# Patient Record
Sex: Male | Born: 1940 | Race: White | Hispanic: No | Smoking: Never smoker
Health system: Southern US, Community
[De-identification: ages and names within clinical notes are randomized; demographics above are authoritative.]

## PROBLEM LIST (undated history)

## (undated) DIAGNOSIS — I499 Cardiac arrhythmia, unspecified: Secondary | ICD-10-CM

## (undated) DIAGNOSIS — C61 Malignant neoplasm of prostate: Secondary | ICD-10-CM

## (undated) DIAGNOSIS — G44009 Cluster headache syndrome, unspecified, not intractable: Secondary | ICD-10-CM

## (undated) DIAGNOSIS — I4891 Unspecified atrial fibrillation: Secondary | ICD-10-CM

## (undated) DIAGNOSIS — Z7982 Long term (current) use of aspirin: Secondary | ICD-10-CM

## (undated) DIAGNOSIS — I10 Essential (primary) hypertension: Secondary | ICD-10-CM

## (undated) HISTORY — PX: INGUINAL HERNIA REPAIR: SUR1180

## (undated) HISTORY — DX: Essential (primary) hypertension: I10

## (undated) HISTORY — DX: Malignant neoplasm of prostate: C61

## (undated) HISTORY — DX: Long term (current) use of aspirin: Z79.82

## (undated) HISTORY — PX: CHOLECYSTECTOMY: SHX55

## (undated) HISTORY — PX: PROSTATECTOMY: SHX69

## (undated) HISTORY — DX: Cardiac arrhythmia, unspecified: I49.9

## (undated) HISTORY — DX: Unspecified atrial fibrillation: I48.91

## (undated) HISTORY — DX: Cluster headache syndrome, unspecified, not intractable: G44.009

---

## 2004-03-07 HISTORY — PX: TOTAL KNEE ARTHROPLASTY: SHX125

## 2007-03-08 DIAGNOSIS — C61 Malignant neoplasm of prostate: Secondary | ICD-10-CM

## 2007-03-08 HISTORY — DX: Malignant neoplasm of prostate: C61

## 2007-08-30 ENCOUNTER — Ambulatory Visit (HOSPITAL_COMMUNITY): Admission: RE | Admit: 2007-08-30 | Discharge: 2007-08-30 | Payer: Self-pay | Admitting: Urology

## 2007-10-30 ENCOUNTER — Ambulatory Visit: Admission: RE | Admit: 2007-10-30 | Discharge: 2007-11-20 | Payer: Self-pay | Admitting: Radiation Oncology

## 2007-11-15 ENCOUNTER — Inpatient Hospital Stay (HOSPITAL_COMMUNITY): Admission: RE | Admit: 2007-11-15 | Discharge: 2007-11-16 | Payer: Self-pay | Admitting: Urology

## 2007-11-15 ENCOUNTER — Encounter (INDEPENDENT_AMBULATORY_CARE_PROVIDER_SITE_OTHER): Payer: Self-pay | Admitting: Urology

## 2008-01-02 ENCOUNTER — Ambulatory Visit: Admission: RE | Admit: 2008-01-02 | Discharge: 2008-03-13 | Payer: Self-pay | Admitting: Radiation Oncology

## 2008-03-13 ENCOUNTER — Ambulatory Visit: Admission: RE | Admit: 2008-03-13 | Discharge: 2008-04-13 | Payer: Self-pay | Admitting: Radiation Oncology

## 2009-09-29 ENCOUNTER — Encounter: Payer: Self-pay | Admitting: Cardiology

## 2009-09-29 ENCOUNTER — Ambulatory Visit: Payer: Self-pay | Admitting: Cardiology

## 2009-10-27 ENCOUNTER — Encounter: Payer: Self-pay | Admitting: Cardiology

## 2009-10-27 ENCOUNTER — Ambulatory Visit: Payer: Self-pay | Admitting: Cardiology

## 2009-10-28 ENCOUNTER — Encounter: Payer: Self-pay | Admitting: Cardiology

## 2009-10-31 ENCOUNTER — Encounter: Payer: Self-pay | Admitting: Cardiology

## 2009-11-02 ENCOUNTER — Telehealth (INDEPENDENT_AMBULATORY_CARE_PROVIDER_SITE_OTHER): Payer: Self-pay | Admitting: *Deleted

## 2009-12-22 ENCOUNTER — Ambulatory Visit: Payer: Self-pay | Admitting: Cardiology

## 2009-12-22 DIAGNOSIS — Z8546 Personal history of malignant neoplasm of prostate: Secondary | ICD-10-CM

## 2009-12-22 DIAGNOSIS — I480 Paroxysmal atrial fibrillation: Secondary | ICD-10-CM

## 2009-12-22 DIAGNOSIS — R51 Headache: Secondary | ICD-10-CM

## 2009-12-22 DIAGNOSIS — J3489 Other specified disorders of nose and nasal sinuses: Secondary | ICD-10-CM

## 2009-12-22 DIAGNOSIS — R519 Headache, unspecified: Secondary | ICD-10-CM | POA: Insufficient documentation

## 2009-12-22 DIAGNOSIS — I2510 Atherosclerotic heart disease of native coronary artery without angina pectoris: Secondary | ICD-10-CM

## 2009-12-22 DIAGNOSIS — I10 Essential (primary) hypertension: Secondary | ICD-10-CM | POA: Insufficient documentation

## 2010-01-04 ENCOUNTER — Ambulatory Visit (HOSPITAL_COMMUNITY): Admission: RE | Admit: 2010-01-04 | Discharge: 2010-01-04 | Payer: Self-pay | Admitting: Urology

## 2010-01-09 IMAGING — CR DG CHEST 2V
1 series · 1 of 1 positions shown · non-contrast
Comparison: None

CLINICAL DATA: Preoperative radiograph

CHEST - 2 VIEW

[w chest pa *]
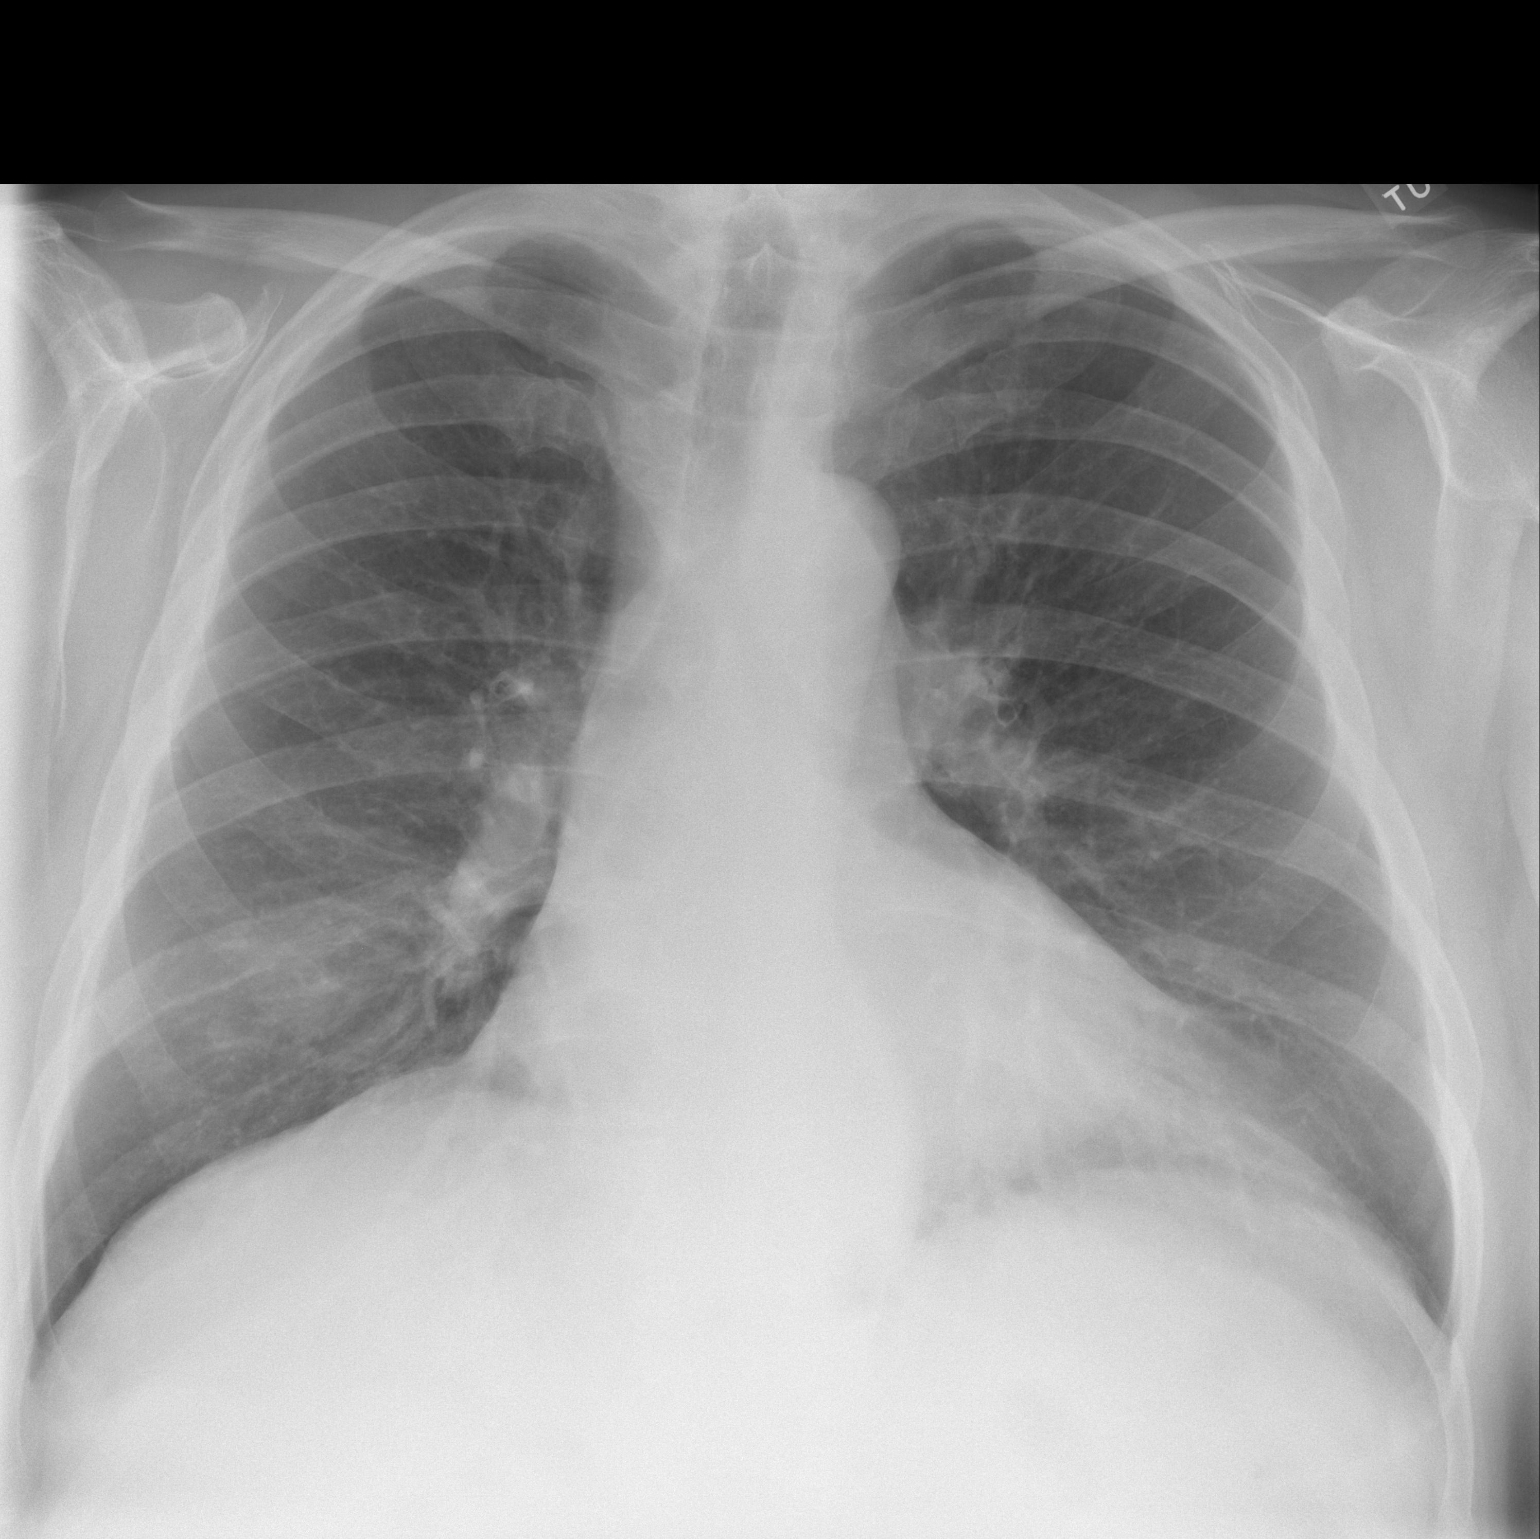

[1 of 1 positions shown; findings below may reference images not displayed]

FINDINGS: There is mild cardiac enlargement.

No pleural effusion or pulmonary interstitial edema

No airspace densities identified.
IMPRESSION: 1.  No active cardiopulmonary abnormalities.
2.  Mild cardiac enlargement.

## 2010-04-06 NOTE — Progress Notes (Signed)
Summary: WANTING TO SPEAK WITH NURSE RE: AFIB  Phone Note Call from Patient Call back at Home Phone [phone]   Caller: patient walk in Reason for Call: Talk to Nurse Details for Reason: afib Summary of Call: PATIENT CAME IN WANTING  TO SPEAK WITH SOMEONE IN REFERENCE TO HIM HAVING ANOTHER EPOSIDE WITH AFIB ON SATURDAY 8/27. STATED HE WENT [facility].  THEY GAVE HIM FLECINAIDE AND HE SAID THAT IT CORRECTED. HE HAS NOT HAD ANY MORE EPOSIDES SINCE SAT.   SHAH PUT HM ON DILTIAZEM & LISINOPRIL AND IS HAVING RINGING IN HIS EYES WITH HEADACHES IN BACK PART OF HIS HEAD & NECK. Initial call taken by: Claudette Laws,  November 02, 2009 8:43 AM  Follow-up for Phone Call        nurse called patient and he wanted to know if these medications would cause him to have ringing in the ears,head and neck pain. Patient said symptoms started within a week of starting flecainide. Patient said the back of head, and neck muscles behind ears hurt that lasted for 3 days which stopped now. Patient didn't take anything for the dull pain and said he's not sure if its from sinus or meds and had CT of sinus last week which was normal. Nurse informed patient that headache was a side effect of flecainide and MD would be informed for any recommendations. Patient has f/u with Sherryll Burger on next week and [facility] in October. Follow-up by: Carlye Grippe,  November 03, 2009 10:27 AM  Additional Follow-up for Phone Call Additional follow up Details #1::        I am not sure he is still on flecainide. Let me know, I don't think is necessarily related to medications.   Yes,patient is still taking flecainide. Please advise.Carlye Grippe  November 04, 2009 2:55 PM  Additional Follow-up by: Lewayne Bunting, MD, [facility],  November 04, 2009 12:57 PM    Additional Follow-up for Phone Call Additional follow up Details #2::    Will need to see in office.  Follow-up by: Lewayne Bunting, MD, [facility],  November 05, 2009 1:49 PM  Additional Follow-up for Phone  Call Additional follow up Details #3:: Details for Additional Follow-up Action Taken: left message on machine to call office.Carlye Grippe  November 05, 2009 3:37 PM Patient informed of the above. Patient informed nurse that his headache had eased up now and he hasn't had anymore problems.    Additional Follow-up by: Carlye Grippe,  November 05, 2009 4:18 PM

## 2010-04-06 NOTE — Consult Note (Signed)
Summary: CARDIOLOGY CONSULT/ [facility]  CARDIOLOGY CONSULT/ [facility]   Imported By: Zachary George 12/22/2009 09:10:26  _____________________________________________________________________  External Attachment:    Type:   Image     Comment:   External Document

## 2010-04-06 NOTE — Consult Note (Signed)
Summary: DR.EWAIN Elwanda [place] [facility]  DR[facility]EWAIN WILSON CONSULT [facility]   Imported By: Zachary George 12/22/2009 10:45:52  _____________________________________________________________________  External Attachment:    Type:   Image     Comment:   External Document

## 2010-04-06 NOTE — Assessment & Plan Note (Signed)
Summary: eph-post [facility] 8/24   Visit Type:  Follow-up Primary Provider:  Beatrix Fetters Shah,MD   History of Present Illness: the patient is a 70 year old male with a history of paroxysmal atrial fibrillation. The patient was hospitalized in August for atrial fibrillation and converted to normal sinus rhythm with 300 mg of p.o. flecainide. An echocardiogram showed normal LV function and a normal Cardiolite. His BNP level was near  normal at 143. His CHADS2 score was only one and the patient only required aspirin. He was discharged with pill-in-the- pocket approach for future management of atrial fibrillation. He presented back to the emergency room a week later and required 300 mg p.o. flecainide with restoration of normal sinus rhythm. He has done well since that time with no recurrent palpitations. He currently remains in normal sinus rhythm. He does have difficulty controlling his blood pressure and recently some medication changes were made. He notices that his blood pressures highest in the morning he was given hydrochlorothiazide but cannot take this every day due to significant diuresis. The patient also has not had a workup for sleep apnea.  Preventive Screening-Counseling & Management  Alcohol-Tobacco     Smoking Status: never  Current Medications (verified): 1)  Cardizem Cd 240 Mg Xr24h-Cap (Diltiazem Hcl Coated Beads) .... Take 1 Tablet By Mouth Once A Day 2)  Benicar 20 Mg Tabs (Olmesartan Medoxomil) .... Take 1 Tablet By Mouth Two Times A Day 3)  Chlorthalidone 25 Mg Tabs (Chlorthalidone) .... Take 1/2 Tab (12.5mg ) Daily 4)  Flecainide Acetate 150 Mg Tabs (Flecainide Acetate) .... Take 2 Tablet By Mouth As Needed A-Fib 5)  Aspirin 325 Mg Tabs (Aspirin) .... Take 1 Tablet By Mouth Once A Day As Needed 6)  Flonase 50 Mcg/act Susp (Fluticasone Propionate) .... One - Two Puffs in Each Nostril Two Times A Day As Needed  Allergies: 1)  ! Demerol  Comments:  Nurse/Medical Assistant: The  patient's medication bottles and allergies were reviewed with the patient and were updated in the Medication and Allergy Lists.  Past History:  Past Surgical History: Last updated: 12/22/2009 Left total knee replacement in [address] Cholecystectomy Rt. Inguinal hernia repair  Social History: Last updated: 12/22/2009 Widowed  Tobacco Use - No.  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  The patient denies fatigue, malaise, fever, weight gain/loss, vision loss, decreased hearing, hoarseness, chest pain, palpitations, shortness of breath, prolonged cough, wheezing, sleep apnea, coughing up blood, abdominal pain, blood in stool, nausea, vomiting, diarrhea, heartburn, incontinence, blood in urine, muscle weakness, joint pain, leg swelling, rash, skin lesions, fainting, dizziness, depression, anxiety, enlarged lymph nodes, easy bruising or bleeding, and environmental allergies.         congestion  Vital Signs:  Patient profile:   70 year old male Height:      73 inches Weight:      253 pounds BMI:     33.50 Pulse rate:   64 / minute BP sitting:   148 / 89  (left arm) Cuff size:   large  Vitals Entered By: Carlye Grippe (December 22, 2009 10:16 AM)  Nutrition Counseling: Patient's BMI is greater than 25 and therefore counseled on  weight management options.  Physical Exam  Additional Exam:  General: Well-developed, well-nourished in no distress head: Normocephalic and atraumatic eyes PERRLA/EOMI intact, conjunctiva and lids normal nose: No deformity or lesions mouth normal dentition, normal posterior  pharynx neck: Supple, no JVD.  No masses, thyromegaly or abnormal cervical nodes lungs: Normal breath sounds bilaterally without wheezing.  Normal percussion heart: regular rate and rhythm with normal S1 and S2, no S3 or S4.  PMI is normal.  No pathological murmurs abdomen: Normal bowel sounds, abdomen is soft and nontender without masses, organomegaly or hernias noted.  No hepatosplenomegaly musculoskeletal: Back normal, normal gait muscle strength and tone normal pulsus: Pulse is normal in all 4 extremities Extremities: No peripheral pitting edema neurologic: Alert and oriented x 3 skin: Intact without lesions or rashes cervical nodes: No significant adenopathy psychologic: Normal affect    EKG  Procedure date:  12/22/2009  Findings:      normal sinus rhythm. Left axis deviation nonspecific ST-T wave changes.  Impression & Recommendations:  Problem # 1:  ATRIAL FIBRILLATION (ICD-427.31)  Patient remains in normal sinus rhythm. We will continue the management of his atrial fibrillation with a pill in the pocket approach with 300 mg of flecainide. He is at low risk for thromboembolic disease and will remain on aspirin His updated medication list for this problem includes:    Flecainide Acetate 150 Mg Tabs (Flecainide acetate) .Marland Kitchen... Take 2 tablet by mouth as needed a-fib    Aspirin 325 Mg Tabs (Aspirin) .Marland Kitchen... Take 1 tablet by mouth once a day as needed  Orders: EKG w/ Interpretation (93000)  His updated medication list for this problem includes:    Flecainide Acetate 150 Mg Tabs (Flecainide acetate) .Marland Kitchen... Take 2 tablet by mouth as needed a-fib    Aspirin 325 Mg Tabs (Aspirin) .Marland Kitchen... Take 1 tablet by mouth once a day as needed  Problem # 2:  HEADACHE (ICD-784.0) Gven his difficult to control blood pressure and recurrent headaches the patient should be screened for obstructive sleep apnea. This can be performed by his primary care physician. His updated medication list for this  problem includes:    Aspirin 325 Mg Tabs (Aspirin) .Marland Kitchen... Take 1 tablet by mouth once a day as needed  Problem # 3:  HYPERTENSION (ICD-401.9)  blood pressure appears to be difficult to control. He does not take hydrochlorothiazide every day and I suggested to the patient that we would switch him to chlorthalidone 12.5 mg p.o. q. daily His updated medication list for this problem includes:    Cardizem Cd 240 Mg Xr24h-cap (Diltiazem hcl coated beads) .Marland Kitchen... Take 1 tablet by mouth once a day    Benicar 20 Mg Tabs (Olmesartan medoxomil) .Marland Kitchen... Take 1 tablet by mouth two times a day    Chlorthalidone 25 Mg Tabs (Chlorthalidone) .Marland Kitchen... Take 1/2 tab (12.5mg ) daily    Aspirin 325 Mg Tabs (Aspirin) .Marland Kitchen... Take 1 tablet by mouth once a day as needed  His updated medication list for this problem includes:    Cardizem Cd 240 Mg Xr24h-cap (Diltiazem hcl coated beads) .Marland Kitchen... Take 1 tablet by mouth once a day    Benicar 20 Mg Tabs (Olmesartan medoxomil) .Marland Kitchen... Take 1 tablet by mouth two times a day    Chlorthalidone 25 Mg Tabs (Chlorthalidone) .Marland Kitchen... Take 1/2 tab (12.5mg ) daily    Aspirin 325 Mg Tabs (Aspirin) .Marland Kitchen... Take 1 tablet by mouth once a day as needed  Problem # 4:  OTHER DISEASES OF NASAL CAVITY AND  SINUSES (ICD-478.19)  Ihave given the patient a prescription of Flonase nasal spray but he will have a followup with ENT to further elucidate the problem is chronic sinus drainage and/or sinusitis  Patient Instructions: 1)  Flonase Nasal Spray 2)  Stop HCTZ 3)  Begin Chlorthalidone 12.5mg  daily 4)  Follow up in  6 months Prescriptions: FLONASE 50 MCG/ACT SUSP (FLUTICASONE PROPIONATE) one - two puffs in each nostril two times a day as needed  #1 x 1   Entered by:   Hoover Brunette, LPN   Authorized by:   Lewayne Bunting, MD, [facility]   Signed by:   Hoover Brunette, LPN on 16/12/9602   Method used:   Electronically to        [facility]* (retail)       [address]       [place], [place]  [address]        Ph: [phone]       Fax: [phone]   RxID:   [id number] CHLORTHALIDONE 25 MG TABS (CHLORTHALIDONE) take 1/2 tab (12.5mg ) daily  #15 x 6   Entered by:   Hoover Brunette, LPN   Authorized by:   Lewayne Bunting, MD, [facility]   Signed by:   Hoover Brunette, LPN on 01/01/2535   Method used:   Electronically to        [facility]* (retail)       [address]       [place], [place]  [address]       Ph: [phone]       Fax: [phone]   RxID:   [id number]   Handout requested.

## 2010-04-06 NOTE — Letter (Signed)
Summary: [facility] D[facility]C DR. Beatrix Fetters [facility]  [facility] D[facility]C DR. Kirstie Peri   Imported By: Zachary George 12/22/2009 09:25:06  _____________________________________________________________________  External Attachment:    Type:   Image     Comment:   External Document

## 2010-05-19 LAB — BASIC METABOLIC PANEL
BUN: 15 mg/dL (ref 6–23)
Creatinine, Ser: 1.13 mg/dL (ref 0.4–1.5)
GFR calc non Af Amer: >60 mL/min (ref >60)

## 2010-05-19 LAB — CBC
Platelets: 205 10*3/uL (ref 150–400)
RDW: 17 % — ABNORMAL HIGH (ref 11.5–15.5)
WBC: 7.2 10*3/uL (ref 4.0–10.5)

## 2010-05-19 LAB — SURGICAL PCR SCREEN: MRSA, PCR: NEGATIVE

## 2010-07-20 NOTE — Op Note (Signed)
NAME:  BRALEN, WILTGEN NO.:  [id number]   MEDICAL RECORD NO.:  [id number]          PATIENT TYPE:  INP   LOCATION:  1432                         FACILITY:  [facility]   PHYSICIAN:  Heloise Purpura, MD      DATE OF BIRTH:  [birth year]   DATE OF PROCEDURE:  11/15/2007  DATE OF DISCHARGE:                               OPERATIVE REPORT   PREOPERATIVE DIAGNOSIS:  Clinically localized adenocarcinoma of the  prostate (clinical stage T1c Nx Mx).   POSTOPERATIVE DIAGNOSIS:  Clinically localized adenocarcinoma of the  prostate (clinical stage T1c Nx Mx).   PROCEDURES:  1. Robotic-assisted laparoscopic radical prostatectomy (partial      bilateral nerve-sparing).  2. Bilateral laparoscopic pelvic lymphadenectomy.   SURGEON:  Heloise Purpura, MD   ASSISTANT:  Delia Chimes, RN, and Delman Kitten, MD.   ANESTHESIA:  General.   COMPLICATIONS:  None.   ESTIMATED BLOOD LOSS:  250 mL.   INTRAVENOUS FLUIDS:  2 liters of lactated Ringer's.   SPECIMENS:  1. Prostate and seminal vesicles.  2. Right pelvic lymph nodes.  3. Left pelvic lymph nodes.   DISPOSITION OF SPECIMENS:  To pathology.   DRAINS:  1. A 20-French coude catheter.  2. A #19 Blake pelvic drain.   INDICATION:  Mr. Mccaughey is a 70 year old gentleman with clinically  localized adenocarcinoma of the prostate.  After discussion regarding  management options for treatment, he elected to proceed with surgical  therapy and the above procedures.  Potential risks, complications, and  alternative treatment options were discussed in detail with the patient  and informed consent was obtained.   DESCRIPTION OF PROCEDURE:  The patient was taken to the operating room  and a general anesthetic was administered.  He was given preoperative  antibiotics, placed in the dorsal lithotomy position, and prepped and  draped in the usual sterile fashion.  Next a preoperative time-out was  performed.  A site was selected just to the left  of the umbilicus for  placement of the camera port and a 12-mm port was then placed using an  open Hasson approach.  This allowed entry in the peritoneal cavity under  direct vision and without difficulty.  The 12-mm port was then placed  and a pneumoperitoneum established.  The 0-degree lens was then used to  inspect the abdomen and there was no evidence for any intra-abdominal  injuries.  The patient was noted to have a large right direct hernia  defect in the inguinal region.  He was noted to have a large right  inguinal hernia preoperatively and had been evaluated by Dr. Corliss Skains and  it was felt that this would best be surgically treated at a separate  setting.  The patient was not noted to have any bowel or other intra-  abdominal contents within the hernia defect.  The remaining ports were  then placed with bilateral 8-mm robotic ports placed 10 cm lateral to  and just inferior to the camera port site.  An additional port was  placed in the far left lateral abdominal wall.  A 5-mm port was placed  between the camera port and the right robotic port.  An additional 12-mm  port was placed in the far right lateral abdominal wall for laparoscopic  assistance.  All ports were placed under direct vision and without  difficulty.  The surgical cart was then docked.  With the aid of the  cautery scissors, the bladder was reflected posteriorly allowing entry  into space of Retzius and identification of the endopelvic fascia and  prostate.  The endopelvic fascia was then incised from the apex back to  the base of prostate bilaterally and the underlying levator muscle  fibers were swept laterally off the prostate, thereby isolating the  dorsal venous complex.  The dorsal venous complex was then stapled and  divided with a 45-mm flex ETS stapler.  The bladder neck was identified  with the aid of Foley catheter manipulation and was divided anteriorly,  thereby exposing the Foley catheter.  The  catheter balloon was deflated  and the catheter was brought into the operative field and used to  retract the prostate anteriorly.  This exposed the posterior bladder  neck, which was then divided and dissection proceeded posteriorly  between the bladder and prostate until the vasa deferentia and seminal  vesicles were identified.  The vasa deferentia were isolated, divided  and lifted anteriorly.  The seminal vesicles were then dissected down to  their tips with care to control the seminal vesicle arterial blood  supply.  The seminal vesicles were then lifted anteriorly and the space  between Denonvilliers fascia and the anterior rectum was bluntly  developed, thereby isolating the vascular pedicles of the prostate.  The  lateral prostatic fascia was incised bilaterally allowing the  neurovascular bundles to be released.  Hem-o-lok clips were then used to  ligate the vascular pedicles of the prostate.  Due to the patient's  preoperative sexual inactivity and the large-volume, although  nonpalpable, cancer on his biopsy, a partial nerve-sparing procedure was  performed leaving some periprostatic tissue on the prostate but sparing  the neurovascular bundles.  The neurovascular bundles were swept off the  apex of the prostate and the urethra was divided allowing the specimen  to be disarticulated.  The pelvis was copiously irrigated and hemostasis  was ensured.  There was no evidence of a rectal injury.  Attention then  turned the right pelvic sidewall.  The fibrofatty tissue between the  external iliac vein, confluence of the iliac vessels, the internal iliac  artery and Cooper's ligament was dissected free from the pelvic sidewall  with care to preserve the obturator nerve.  Hem-o-lok were used for  lymphostasis and hemostasis.  An identical procedure was then performed  on the contralateral side.  Attention then turned to the urethral  anastomosis.  A 2-0 Vicryl slip-knot was placed  between Denonvilliers  fascia, the posterior bladder neck and the posterior urethra to  reapproximate these structures.  A double-armed 3-0 Monocryl suture was  then used to perform a 360-degree running tension-free anastomosis  between the bladder neck and urethra.  A new 20-French coude catheter  was inserted into the bladder and irrigated.  There were no blood clots  within the bladder and the anastomosis appeared to be watertight.  A #19  Blake drain was then brought through the left robotic port and  appropriately positioned in the pelvis.  It was secured to skin with a  nylon suture.  The surgical cart was then undocked.  The right lateral  12-mm port site was closed  with a 0 Vicryl suture.  This was placed with  the aid of the suture passer device.  The prostate specimen and each  lymphatic packet, which had been tagged for identification, were then  removed within the Endopouch retrieval bag via the periumbilical port  site.  All remaining ports were removed under direct vision and the  periumbilical fascial opening was closed with a running 0 Vicryl suture.  All port sites were injected with 0.25% Marcaine and  reapproximated at the skin level with staples.  Sterile dressings were  applied.  The patient appeared to tolerate the procedure well and  without complications.  He was able to be extubated and transferred to  the recovery unit in satisfactory condition.      Heloise Purpura, MD  Electronically Signed     LB/MEDQ  D:  11/15/2007  T:  11/16/2007  Job:  [id number]

## 2010-07-20 NOTE — Discharge Summary (Signed)
NAME:  Vincent Moran, Vincent Moran NO.:  [id number]   MEDICAL RECORD NO.:  [id number]          PATIENT TYPE:  INP   LOCATION:  1432                         [facility]AC[facility]   PHYSICIAN:  Heloise Purpura, MD      DATE OF BIRTH:  June 15, 1940   DATE OF ADMISSION:  11/15/2007  DATE OF DISCHARGE:  11/16/2007                               DISCHARGE SUMMARY   ADMISSION DIAGNOSIS:  Clinically localized adenocarcinoma of the  prostate, clinical stage T1C, NX, MX.   DISCHARGE DIAGNOSES:  Clinically localized adenocarcinoma of the  prostate, clinical stage T1C, NX, MX.   PROCEDURES:  1. Robotic-assisted laparoscopic radical prostatectomy.  2. Bilateral laparoscopic pelvic lymphadenectomy.   HISTORY AND PHYSICAL:  For full details, please see admission history  and physical.  Briefly, Mr. Vincent Moran is a 70 year old gentleman, who was  found to have clinically localized adenocarcinoma of the prostate.  After careful consideration regarding management options for treatment,  he elected to proceed with surgical therapy with a robotic-assisted  laparoscopic radical prostatectomy with partial bilateral nerve spare  and bilateral laparoscopic pelvic lymphadenectomy.   HOSPITAL COURSE:  On November 15, 2007, Mr. Vincent Moran was taken to the  operating room and underwent a robotic-assisted laparoscopic radical  prostatectomy and bilateral laparoscopic pelvic lymphadenectomy, which  he tolerated well and without complications.  Postoperatively, he was  able to be transferred to a regular hospital room following recovery  from anesthesia.  He was able to begin ambulating the night of surgery,  which he did without difficulty.  He was monitored that evening and  remained hemodynamically stable.  His hematocrit was checked  postoperatively and found to be stable at 43.8.  On the morning of  postoperative day #1, his hematocrit was rechecked and found to be 41.6.  He did have excellent urine output with  expected minimal output from his  pelvic drain.  Therefore, his pelvic drain was removed.  He did complain  of some feelings of being swimmy-headed, but urine output, blood  pressure and pulse all remained stable.  He was placed on a clear liquid  diet and continued to ambulate.  He was re-evaluated on the afternoon of  postoperative day #1 and had reported passing flatus.  He continued to  have some feelings of being swimmy-headed, but to a lesser degree.  His urine output, blood pressure and pulse all continued to be stable.  It was felt that these feelings were likely due from the anesthetics  used during surgery, as he stated receiving same type of effects after  other surgical procedures.  He was, therefore, felt to be stable for  discharge and had met all discharge criteria.   DISPOSITION:  Home.   DISCHARGE MEDICATIONS:  He was instructed to resume his regular home  medications, including Norvasc and Vasotec.  In addition, he was  provided a prescription for Vicodin to take as needed for pain and told  to use Colace as a stool softener.  He was given a prescription to begin  Cipro one day prior to his return visit for Foley catheter removal.  DISCHARGE INSTRUCTIONS:  He was instructed to be ambulatory, but  specifically told to refrain from heavy lifting, strenuous activity or  driving.  He was instructed on routine Foley catheter care and told to  gradually advance his diet over the course of the next few days.   FOLLOWUP:  He will follow up in one week for removal of Foley catheter  and skin staples.      Delia Chimes, RN      Heloise Purpura, MD  Electronically Signed    MA/MEDQ  D:  11/17/2007  T:  11/17/2007  Job:  [id number]

## 2010-07-29 DIAGNOSIS — I4891 Unspecified atrial fibrillation: Secondary | ICD-10-CM

## 2010-07-30 ENCOUNTER — Encounter: Payer: Self-pay | Admitting: *Deleted

## 2010-08-03 ENCOUNTER — Ambulatory Visit (INDEPENDENT_AMBULATORY_CARE_PROVIDER_SITE_OTHER): Payer: Medicare Other | Admitting: Cardiology

## 2010-08-03 ENCOUNTER — Encounter: Payer: Self-pay | Admitting: *Deleted

## 2010-08-03 ENCOUNTER — Encounter: Payer: Self-pay | Admitting: Cardiology

## 2010-08-03 VITALS — BP 147/98 | HR 67

## 2010-08-03 DIAGNOSIS — R51 Headache: Secondary | ICD-10-CM

## 2010-08-03 DIAGNOSIS — I10 Essential (primary) hypertension: Secondary | ICD-10-CM

## 2010-08-03 DIAGNOSIS — I4891 Unspecified atrial fibrillation: Secondary | ICD-10-CM

## 2010-08-03 MED ORDER — ENALAPRIL MALEATE 5 MG PO TABS: 5.0000 mg | ORAL_TABLET | Freq: Two times a day (BID) | ORAL | Status: DC

## 2010-08-03 NOTE — Assessment & Plan Note (Signed)
Hypertension: Blood pressure poorly controlled and will increase enalapril to 5 mg twice a day.

## 2010-08-03 NOTE — Patient Instructions (Signed)
Your physician wants you to follow-up in: 6 months. You will receive a reminder letter in the mail one-two months in advance. If you don't receive a letter, please call our office to schedule the follow-up appointment.  Increase Enalapril to 5mg  two times a day.

## 2010-08-03 NOTE — Progress Notes (Signed)
HPI the patient is a 70 year old male with a history of paroxysmal atrial fibrillation. The patient was hospitalized in August for atrial fibrillation and converted to normal sinus rhythm with 300 mg of p.o. flecainide. An echocardiogram showed normal LV function and a normal Cardiolite. His BNP level was near  normal at 143. His CHADS2 score was only one and the patient only required aspirin. He was discharged with pill-in-the- pocket approach for future management of atrial fibrillation. He presented back to the emergency room a week later and required 300 mg p.o. flecainide with restoration of normal sinus rhythm. He has done well since that time with no recurrent palpitations.  However just last week the patient to come back in the hospital because of palpitations and recurrence of atrial fibrillation. This occurred in the setting of sinusitis and hypertension. He was given Cardizem in the emergency room and apparently when back in normal sinus rhythm. He did not have diffuse flecainide. Today he is in normal sinus rhythm. He states that his been doing well. He had no recurrent palpitations his blood pressure however is poorly controlled. He has had persistent headache since hospitalization. However his primary care physician did a CT scan and MRI which were within normal limits. From a cardiovascular standpoint the patient is doing well. EKG normal sinus rhythm heart rate 59 beats per minute. No other significant abnormalities.  Allergies  Allergen Reactions  . Meperidine Hcl     REACTION: hyper/hallucinate    Current Outpatient Prescriptions on File Prior to Visit  Medication Sig Dispense Refill  . aspirin 325 MG tablet Take 325 mg by mouth daily as needed.        . chlorthalidone (HYGROTON) 25 MG tablet Take 12.5 mg by mouth daily.        . flecainide (TAMBOCOR) 150 MG tablet Take 300 mg by mouth as needed. As needed       . fluticasone (FLONASE) 50 MCG/ACT nasal spray 2 sprays by Nasal  route daily.        Marland Kitchen DISCONTD: diltiazem (CARDIZEM CD) 240 MG 24 hr capsule Take 240 mg by mouth daily.        Marland Kitchen DISCONTD: olmesartan (BENICAR) 20 MG tablet Take 20 mg by mouth 2 (two) times daily.          Past Medical History  Diagnosis Date  . Hypertension   . Prostate cancer 2009    Status post robotic radical prostatectomy in 2009  . Atrial fibrillation   . Headaches, cluster   . Cardiac dysrhythmia, unspecified   . Encounter for long-term (current) use of aspirin     Past Surgical History  Procedure Date  . Total knee arthroplasty 2006  . Cholecystectomy   . Inguinal hernia repair     RIGHT    No family history on file.  History   Social History  . Marital Status: Widowed    Spouse Name: N/A    Number of Children: N/A  . Years of Education: N/A   Occupational History  . RETIRED    Social History Main Topics  . Smoking status: Never Smoker   . Smokeless tobacco: Never Used  . Alcohol Use: No  . Drug Use: No  . Sexually Active: Not on file   Other Topics Concern  . Not on file   Social History Narrative   Last updated: 10/18/2011Widowed Tobacco Use - No. Alcohol Use - yes on occasionRetired from [employer]   OZD:[id number] positives as outlined above. The  remainder of the 18  point review of systems is negative  PHYSICAL EXAM BP 147/98  Pulse 67  General: Well-developed, well-nourished in no distress Head: Normocephalic and atraumatic Eyes:PERRLA/EOMI intact, conjunctiva and lids normal Ears: No deformity or lesions Mouth:normal dentition, normal posterior pharynx Neck: Supple, no JVD.  No masses, thyromegaly or abnormal cervical nodes Lungs: Normal breath sounds bilaterally without wheezing.  Normal percussion Cardiac: regular rate and rhythm with normal S1 and S2, no S3 or S4.  PMI is normal.  No pathological murmurs Abdomen: Normal bowel sounds, abdomen is soft and nontender without masses, organomegaly or hernias noted.  No hepatosplenomegaly MSK:  Back normal, normal gait muscle strength and tone normal Vascular: Pulse is normal in all 4 extremities Extremities: No peripheral pitting edema Neurologic: Alert and oriented x 3 Skin: Intact without lesions or rashes Lymphatics: No significant adenopathy Psychologic: Normal affect  ECG:EKG normal sinus rhythm heart rate 59 beats per minute. No other significant abnormalities.  ASSESSMENT AND PLAN

## 2010-08-03 NOTE — Assessment & Plan Note (Signed)
Paroxysmal Atrial fibrillation: Patient remains now in normal sinus rhythm. He did not require flecainide during his recent admission. It appears that every time his atrial fibrillation is set off by an acute infectious process.

## 2010-08-03 NOTE — Assessment & Plan Note (Signed)
Evaluated by his primary care physician with CT and MRI

## 2010-09-29 DIAGNOSIS — I4891 Unspecified atrial fibrillation: Secondary | ICD-10-CM

## 2010-10-02 ENCOUNTER — Emergency Department (HOSPITAL_COMMUNITY)
Admission: EM | Admit: 2010-10-02 | Discharge: 2010-10-02 | Disposition: A | Discharge status: Home / Self Care | Payer: Medicare Other | Attending: Emergency Medicine | Admitting: Emergency Medicine

## 2010-10-02 ENCOUNTER — Emergency Department (HOSPITAL_COMMUNITY): Payer: Medicare Other

## 2010-10-02 DIAGNOSIS — Z79899 Other long term (current) drug therapy: Secondary | ICD-10-CM | POA: Insufficient documentation

## 2010-10-02 DIAGNOSIS — K5730 Diverticulosis of large intestine without perforation or abscess without bleeding: Secondary | ICD-10-CM | POA: Insufficient documentation

## 2010-10-02 DIAGNOSIS — K5900 Constipation, unspecified: Secondary | ICD-10-CM | POA: Insufficient documentation

## 2010-10-02 DIAGNOSIS — R51 Headache: Secondary | ICD-10-CM | POA: Insufficient documentation

## 2010-10-02 DIAGNOSIS — R110 Nausea: Secondary | ICD-10-CM | POA: Insufficient documentation

## 2010-10-02 DIAGNOSIS — R109 Unspecified abdominal pain: Secondary | ICD-10-CM | POA: Insufficient documentation

## 2010-10-02 DIAGNOSIS — I4891 Unspecified atrial fibrillation: Secondary | ICD-10-CM | POA: Insufficient documentation

## 2010-10-02 LAB — DIFFERENTIAL
Basophils Relative: 0 % (ref 0–1)
Eosinophils Absolute: 0.1 10*3/uL (ref 0.0–0.7)
Eosinophils Relative: 2 % (ref 0–5)
Monocytes Relative: 8 % (ref 3–12)
Neutrophils Relative %: 83 % — ABNORMAL HIGH (ref 43–77)

## 2010-10-02 LAB — CBC
MCH: 27.6 pg (ref 26.0–34.0)
Platelets: 225 10*3/uL (ref 150–400)
RBC: 5.21 MIL/uL (ref 4.22–5.81)
RDW: 16.5 % — ABNORMAL HIGH (ref 11.5–15.5)
WBC: 8.5 10*3/uL (ref 4.0–10.5)

## 2010-10-02 LAB — URINE MICROSCOPIC-ADD ON

## 2010-10-02 LAB — COMPREHENSIVE METABOLIC PANEL
ALT: 50 U/L (ref 0–53)
AST: 21 U/L (ref 0–37)
Albumin: 3.6 g/dL (ref 3.5–5.2)
CO2: 25 mEq/L (ref 19–32)
Calcium: 9.5 mg/dL (ref 8.4–10.5)
GFR calc non Af Amer: >60 mL/min (ref >60)
Sodium: 140 mEq/L (ref 135–145)
Total Protein: 7 g/dL (ref 6.0–8.3)

## 2010-10-02 LAB — URINALYSIS, ROUTINE W REFLEX MICROSCOPIC
Glucose, UA: NEGATIVE mg/dL
Hgb urine dipstick: NEGATIVE
Specific Gravity, Urine: 1.025 (ref 1.005–1.030)
pH: 6 (ref 5.0–8.0)

## 2010-10-04 ENCOUNTER — Emergency Department (HOSPITAL_COMMUNITY): Payer: Medicare Other

## 2010-10-04 ENCOUNTER — Emergency Department (HOSPITAL_COMMUNITY)
Admission: EM | Admit: 2010-10-04 | Discharge: 2010-10-04 | Disposition: A | Discharge status: Home / Self Care | Payer: Medicare Other | Attending: Emergency Medicine | Admitting: Emergency Medicine

## 2010-10-04 DIAGNOSIS — R112 Nausea with vomiting, unspecified: Secondary | ICD-10-CM | POA: Insufficient documentation

## 2010-10-04 DIAGNOSIS — Z79899 Other long term (current) drug therapy: Secondary | ICD-10-CM | POA: Insufficient documentation

## 2010-10-04 DIAGNOSIS — K5900 Constipation, unspecified: Secondary | ICD-10-CM | POA: Insufficient documentation

## 2010-10-04 DIAGNOSIS — R109 Unspecified abdominal pain: Secondary | ICD-10-CM | POA: Insufficient documentation

## 2010-10-04 DIAGNOSIS — I498 Other specified cardiac arrhythmias: Secondary | ICD-10-CM | POA: Insufficient documentation

## 2010-10-04 LAB — POCT I-STAT, CHEM 8
Calcium, Ion: 1.14 mmol/L (ref 1.12–1.32)
HCT: 47 % (ref 39.0–52.0)
TCO2: 23 mmol/L (ref 0–100)

## 2010-10-04 LAB — URINALYSIS, ROUTINE W REFLEX MICROSCOPIC
Ketones, ur: 40 mg/dL — AB
Leukocytes, UA: NEGATIVE
Nitrite: NEGATIVE
Protein, ur: NEGATIVE mg/dL

## 2010-10-04 LAB — CBC
Hemoglobin: 14.1 g/dL (ref 13.0–17.0)
MCH: 26.6 pg (ref 26.0–34.0)
Platelets: 232 10*3/uL (ref 150–400)
RBC: 5.31 MIL/uL (ref 4.22–5.81)

## 2010-10-04 LAB — DIFFERENTIAL
Basophils Absolute: 0 10*3/uL (ref 0.0–0.1)
Basophils Relative: 0 % (ref 0–1)
Eosinophils Absolute: 0.2 10*3/uL (ref 0.0–0.7)
Neutro Abs: 7.8 10*3/uL — ABNORMAL HIGH (ref 1.7–7.7)
Neutrophils Relative %: 80 % — ABNORMAL HIGH (ref 43–77)

## 2010-12-08 LAB — ABO/RH: ABO/RH(D): O POS

## 2010-12-08 LAB — CBC
MCV: 87.4
Platelets: 230
RDW: 15.7 — ABNORMAL HIGH
WBC: 6.9

## 2010-12-08 LAB — BASIC METABOLIC PANEL
BUN: 10
Chloride: 104
Creatinine, Ser: 0.84
GFR calc non Af Amer: >60
Glucose, Bld: 87

## 2010-12-08 LAB — HEMOGLOBIN AND HEMATOCRIT, BLOOD: HCT: 41.6

## 2010-12-08 LAB — TYPE AND SCREEN

## 2010-12-17 ENCOUNTER — Ambulatory Visit (INDEPENDENT_AMBULATORY_CARE_PROVIDER_SITE_OTHER): Payer: Medicare Other | Admitting: Cardiology

## 2010-12-17 VITALS — BP 152/105 | HR 65 | Ht 73.0 in | Wt 257.0 lb

## 2010-12-17 DIAGNOSIS — I498 Other specified cardiac arrhythmias: Secondary | ICD-10-CM

## 2010-12-17 DIAGNOSIS — I10 Essential (primary) hypertension: Secondary | ICD-10-CM

## 2010-12-17 DIAGNOSIS — Z79899 Other long term (current) drug therapy: Secondary | ICD-10-CM

## 2010-12-17 DIAGNOSIS — I480 Paroxysmal atrial fibrillation: Secondary | ICD-10-CM

## 2010-12-17 DIAGNOSIS — R51 Headache: Secondary | ICD-10-CM

## 2010-12-17 DIAGNOSIS — R001 Bradycardia, unspecified: Secondary | ICD-10-CM

## 2010-12-17 DIAGNOSIS — I2510 Atherosclerotic heart disease of native coronary artery without angina pectoris: Secondary | ICD-10-CM

## 2010-12-17 DIAGNOSIS — I4891 Unspecified atrial fibrillation: Secondary | ICD-10-CM

## 2010-12-17 MED ORDER — CARVEDILOL 6.25 MG PO TABS: 6.2500 mg | ORAL_TABLET | Freq: Two times a day (BID) | ORAL | Status: DC

## 2010-12-17 MED ORDER — CLONIDINE HCL 0.1 MG PO TABS: 0.1000 mg | ORAL_TABLET | Freq: Every day | ORAL | Status: DC

## 2010-12-17 MED ORDER — CHLORTHALIDONE 25 MG PO TABS: 25.0000 mg | ORAL_TABLET | Freq: Every day | ORAL | Status: DC

## 2010-12-17 MED ORDER — ENALAPRIL MALEATE 20 MG PO TABS: 20.0000 mg | ORAL_TABLET | Freq: Two times a day (BID) | ORAL | Status: DC

## 2010-12-17 NOTE — Assessment & Plan Note (Signed)
Medications changes as outlined above. CHADS2 score is still low at 1. Continue aspirin for now. Will need to start Coumadin when patient age 70.

## 2010-12-17 NOTE — Assessment & Plan Note (Signed)
We'll try to simplify patient's medical regimen and I want him to start again on chlorthalidone 25 mg a day [is has been associated with some cramping on his part previously], increase enalapril to 20 mg by mouth twice a day, decrease clonidine to 0.1 mg by mouth each bedtime, discontinue Cardizem, and change to Coreg 6.25 g by mouth twice a day. The patient will need next week in 5 days a BMET to check his potassium and creatinine on high-dose ACE inhibitor. He'll also need a 48 hour Holter monitor to make sure that he does not develop excessive bradycardia on Coreg.

## 2010-12-17 NOTE — Patient Instructions (Signed)
Follow up as scheduled. Your physician has recommended that you wear a holter monitor. Holter monitors are medical devices that record the heart's electrical activity. Doctors most often use these monitors to diagnose arrhythmias. Arrhythmias are problems with the speed or rhythm of the heartbeat. The monitor is a small, portable device. You can wear one while you do your normal daily activities. This is usually used to diagnose what is causing palpitations/syncope (passing out). RETURN ON Monday AS SCHEDULED TO HAVE THIS PLACED.  Chlorthalidone 25 mg daily. Enalapril 20 mg two times a day. Clonidine 0.1 mg every night. Stop Cardizem. Start Coreg (carvedilol) 6.25mg  two times a day.  Your physician recommends that you go to the [facility] for lab work: [employer]. DO IN 5 DAYS.

## 2010-12-17 NOTE — Assessment & Plan Note (Signed)
Felt to be related secondary to Cardizem.

## 2010-12-17 NOTE — Assessment & Plan Note (Signed)
Patient has no history of coronary artery disease and flecainide is to be used safely when necessary in the future

## 2010-12-17 NOTE — Progress Notes (Signed)
History of present illness:  The patient is a 70 year old male with a history of paroxysmal atrial fibrillation managing himself with a pill-in-the- pocket approach with flecainide. The patient has done well. He has had 2 brief episodes of atrial fibrillation very short lived since his last office visit here. Interestingly these episodes have always been somewhat vaguely mediated. One episode occurred when he got really hot and then went inside to drink cold soda and he developed very quickly thereafter a brief episode of atrial fibrillation. Another episode he describes when he went to the emergency room and had to drink contrast and was nauseated. At that time he also developed atrial fibrillation briefly albeit rate controlled.  The patient has had problems with his blood pressure which has been difficult to control requiring various changes in medications. Also his Cardizem eventually has been implicated as a cause for his headaches and has been consistently down titrated. He is now only taking 120 mg a day of Cardizem. Clonidine apparently makes him rather sleepy and dizzy and has also been down titrated. The patient denies any chest pain shortness of breath orthopnea PND. He does have a thyroid evaluation for chronic headaches with an MRI. His EKG today demonstrates sinus bradycardia with a heart rate of 57 beats per minute.   Allergies, family history and social history: As documented in chart and reviewed  Medications: Documented and reviewed in chart.  Past medical history: Reviewed and see problem list below   Review of systems: No nausea or vomiting. No fever or chills. No melena hematochezia. No dysuria or frequency. Occasional palpitations as outlined above. Occasional headaches. No visual changes.   Physical examination : Vital signs documented below General: Well-nourished white male in no apparent distress HEENT: EOMI, PERRLA, normal carotid upstroke and no carotid bruits. No  thyromegaly Lungs: Clear breath sounds bilaterally no wheezing Heart: Regular rate and rhythm with normal S1-S2 and no murmur rubs or gallops Abdomen: Soft nontender no rebound or guarding good bowel sounds. Extremity exam: No cyanosis clubbing or edema Neurologic: Alert and oriented grossly nonfocal. Psychiatric: Normal affect. Vascular exam: Normal peripheral pulses.

## 2010-12-20 ENCOUNTER — Encounter (INDEPENDENT_AMBULATORY_CARE_PROVIDER_SITE_OTHER): Payer: Medicare Other | Admitting: *Deleted

## 2010-12-20 DIAGNOSIS — I498 Other specified cardiac arrhythmias: Secondary | ICD-10-CM

## 2010-12-20 DIAGNOSIS — I4891 Unspecified atrial fibrillation: Secondary | ICD-10-CM

## 2010-12-20 DIAGNOSIS — I10 Essential (primary) hypertension: Secondary | ICD-10-CM

## 2010-12-20 NOTE — Progress Notes (Unsigned)
Pt presents for monitor placement per recent office visit. Monitor placed and instructions given. Pt verbalized understanding.   Pt also states Dr. Earnestine Leys made numerous BP changes during recent office visit. Since this time BP has not been lower than 165/103. Pt states he has felt bad since this time with headaches, face flushed and just feeling awful. He states he emailed his BP's to Dr. Earnestine Leys but has not heard back from him. He states Dr. Earnestine Leys changed his medications to help control his BP better and b/c of headaches with his previous med regimen.   Pt requested appt to see Dr. Earnestine Leys ASAP regarding elevated BP and symptoms. Pt scheduled to see Dr. Earnestine Leys tomorrow at 8:30am. He is aware to go to ER for worsening or concerning symptoms before this appt. Pt verbalized understanding.

## 2010-12-21 ENCOUNTER — Encounter: Payer: Self-pay | Admitting: Cardiology

## 2010-12-21 ENCOUNTER — Ambulatory Visit (INDEPENDENT_AMBULATORY_CARE_PROVIDER_SITE_OTHER): Payer: Medicare Other | Admitting: Cardiology

## 2010-12-21 VITALS — BP 145/92 | HR 56 | Ht 73.0 in | Wt 253.0 lb

## 2010-12-21 DIAGNOSIS — I10 Essential (primary) hypertension: Secondary | ICD-10-CM

## 2010-12-21 DIAGNOSIS — I4891 Unspecified atrial fibrillation: Secondary | ICD-10-CM

## 2010-12-21 DIAGNOSIS — R51 Headache: Secondary | ICD-10-CM

## 2010-12-21 DIAGNOSIS — I480 Paroxysmal atrial fibrillation: Secondary | ICD-10-CM

## 2010-12-21 MED ORDER — CLONIDINE HCL 0.2 MG PO TABS: 0.2000 mg | ORAL_TABLET | Freq: Every day | ORAL | Status: DC

## 2010-12-21 MED ORDER — DILTIAZEM HCL ER COATED BEADS 120 MG PO CP24: 120.0000 mg | ORAL_CAPSULE | Freq: Every day | ORAL | Status: DC

## 2010-12-21 NOTE — Progress Notes (Signed)
History of present illness:  The patient presents for followup after we change his medications in the last clinic visit. He felt that he was having headache secondary to Cardizem. We stop this medication and started him on Coreg and increase his ACE inhibitor as well as given the addition of a diuretic. Unfortunate questions blood pressure is now much worse. He has recordings of systolic blood pressures in the 180s with diastolic blood pressures consistently greater than 100 -110 mmHg. He states that his headaches are still persistent and as a matter fact his morning his headache was worse than before. He does not appear that this related to Cardizem as the patient was thinking. He does have some nasal drainage and is requesting also pain medications for headaches.   Allergies, family history and social history: As documented in chart and reviewed  Medications: Documented and reviewed in chart.  Past medical history: Reviewed and see problem list below   Review of systems:No nausea or vomiting. No fever or chills. No melena hematochezia. No dysuria or frequency   Physical examination : Vital signs documented below General: well-nourished white male in no distress  HEENT: EOMI, PERRLA  Lungs: clear breath sounds bilaterally  Heart: regular rate and rhythm  Abdomen: soft nontender with no rebound or guarding  Extremity exam: no cyanosis clubbing or edema  Neurologic: alert and oriented x3  Psychiatric: normal affect  Vascular examNormal peripheral pulses:

## 2010-12-21 NOTE — Assessment & Plan Note (Signed)
This appears to be a chronic problem and does not appear to be related to Cardizem. I asked the patient to followup with his primary care physician.

## 2010-12-21 NOTE — Patient Instructions (Signed)
   Restart Cardizem CD 120mg  daily  Clonidine 0.2mg  at bedtime  Stop Coreg  Nurse visit in one week for BP check Follow up in  1 month

## 2010-12-21 NOTE — Assessment & Plan Note (Signed)
No clear evidence now that Cardizem is causing headaches. The patient was given an extra clonidine in the office with improvement in his blood pressure. We will reintroduce Cardizem 120 mg by mouth daily and increase clonidine to 0.2 mg by mouth each bedtime. We will get a BMET in one week.

## 2010-12-21 NOTE — Assessment & Plan Note (Signed)
Patient is currently still wearing a 48-hour Holter monitor. We'll review the results once the monitor is discontinued.

## 2010-12-22 ENCOUNTER — Other Ambulatory Visit: Payer: Self-pay | Admitting: *Deleted

## 2010-12-22 MED ORDER — CLONIDINE HCL 0.2 MG PO TABS: 0.2000 mg | ORAL_TABLET | Freq: Every day | ORAL | Status: DC

## 2010-12-28 ENCOUNTER — Ambulatory Visit (INDEPENDENT_AMBULATORY_CARE_PROVIDER_SITE_OTHER): Payer: Medicare Other | Admitting: *Deleted

## 2010-12-28 VITALS — BP 163/96 | HR 64

## 2010-12-28 DIAGNOSIS — I10 Essential (primary) hypertension: Secondary | ICD-10-CM

## 2010-12-28 NOTE — Progress Notes (Signed)
Patient in office today for BP check.  States he is taking all meds just as he was at last OV.  States he usually takes his Chlorthalidone & Vasotec around 8 AM.  Due to take his Cardizem around noon.  Clonidine & Vasotec around 7:30 PM.  Already has follow up scheduled for 11/13 with GD.  States he has been rushing around a little this morning & also has to drive to [facility] for another appointment.  States BP usually running better than this when he checks at home.  This AM around 2:30 (up to bathroom) - 141/87.  Advised him to continue to keep log to bring with him to visit on 11/13.  He verbalized understanding.

## 2010-12-29 ENCOUNTER — Encounter: Payer: Medicare Other | Admitting: *Deleted

## 2011-01-18 ENCOUNTER — Other Ambulatory Visit: Payer: Self-pay | Admitting: *Deleted

## 2011-01-18 ENCOUNTER — Ambulatory Visit (INDEPENDENT_AMBULATORY_CARE_PROVIDER_SITE_OTHER): Payer: Medicare Other | Admitting: Cardiology

## 2011-01-18 ENCOUNTER — Encounter: Payer: Self-pay | Admitting: Cardiology

## 2011-01-18 VITALS — BP 176/98 | HR 66 | Ht 72.0 in | Wt 254.0 lb

## 2011-01-18 DIAGNOSIS — Z79899 Other long term (current) drug therapy: Secondary | ICD-10-CM

## 2011-01-18 DIAGNOSIS — I10 Essential (primary) hypertension: Secondary | ICD-10-CM

## 2011-01-18 DIAGNOSIS — I2510 Atherosclerotic heart disease of native coronary artery without angina pectoris: Secondary | ICD-10-CM

## 2011-01-18 DIAGNOSIS — I480 Paroxysmal atrial fibrillation: Secondary | ICD-10-CM

## 2011-01-18 DIAGNOSIS — I4891 Unspecified atrial fibrillation: Secondary | ICD-10-CM

## 2011-01-18 MED ORDER — SPIRONOLACTONE 25 MG PO TABS: 12.5000 mg | ORAL_TABLET | Freq: Every day | ORAL | Status: DC

## 2011-01-18 NOTE — Assessment & Plan Note (Addendum)
Blood pressure remains difficult to control but overall pattern. We will add spironolactone 12.5 mg by mouth daily to his regimen which can be further increased to 25 mg a day but we will first followup with a BMET given the patient's prior history of potassium of 4.0. His creatinine is normal. I did warn the patient possible painful gynecomastia associated with this drug. We will also check a TSH level

## 2011-01-18 NOTE — Patient Instructions (Addendum)
Your physician wants you to follow-up in: 6 months. You will receive a reminder letter in the mail one-two months in advance. If you don't receive a letter, please call our office to schedule the follow-up appointment. Spironolactone 25 mg-Take 1/2 tablet by mouth daily.  Your physician recommends that you go to the [facility] for lab work: BMET/TSH/MG---DO LAB IN 1 WEEK. If the results of your test are normal or stable, you will receive a letter. If they are abnormal, the nurse will contact you by phone.

## 2011-01-18 NOTE — Assessment & Plan Note (Signed)
No recurrent chest pain. Stable

## 2011-01-18 NOTE — Assessment & Plan Note (Signed)
Patient had a single episode of paroxysmal atrial fibrillation and used a single dose of flecainide 300 mg times one which converted him back to normal sinus rhythm after approximately one hour

## 2011-01-18 NOTE — Progress Notes (Signed)
CC: Hypertension and paroxysmal atrial fibrillation  HPI:  The patient is a 70 year old male with a history of difficult to control hypertension and episodes of paroxysmal atrial fibrillation controlled with when necessary flecainide. The patient states his blood pressure has been running better. I reviewed his records. Although systolic blood pressure has improved his diastolic remains still consistently high between 90-95 mm of mercury. He states that his headaches however a couple of better after decreasing the dose of Cardizem. He had laboratory work done in October which showed a potassium of 4.0 and a normal creatinine. He also wore a Holter monitor which showed no significant arrhythmias only rare PVCs but no recurrent atrial fibrillation. From a cardiac standpoint otherwise the patient has remained stable he did have a single episode of atrial fibrillation and converted back to normal sinus rhythm after single dose of flecainide.  PMH: reviewed and listed in Problem List in Electronic Records (and see below) Past Medical History  Diagnosis Date  . Hypertension   . Prostate cancer 2009    Status post robotic radical prostatectomy in 2009  . Atrial fibrillation   . Headaches, cluster   . Cardiac dysrhythmia, unspecified   . Encounter for long-term (current) use of aspirin      Allergies/SH/FHX : available in Electronic Records for review  Medications: Current Outpatient Prescriptions  Medication Sig Dispense Refill  . aspirin 325 MG tablet Take 325 mg by mouth daily as needed.       . chlorthalidone (HYGROTON) 25 MG tablet Take 1 tablet (25 mg total) by mouth daily.  30 tablet  6  . cloNIDine (CATAPRES) 0.2 MG tablet Take 1 tablet (0.2 mg total) by mouth at bedtime.  30 tablet  6  . diltiazem (CARDIZEM CD) 120 MG 24 hr capsule Take 1 capsule (120 mg total) by mouth daily.  30 capsule  6  . enalapril (VASOTEC) 20 MG tablet Take 1 tablet (20 mg total) by mouth 2 (two) times daily.  60  tablet  6  . flecainide (TAMBOCOR) 150 MG tablet Take 300 mg by mouth as needed. As needed       . fluticasone (FLONASE) 50 MCG/ACT nasal spray Place 2 sprays into the nose daily.        . ranitidine (ZANTAC) 150 MG tablet Take 150 mg by mouth daily as needed.          ROS: No nausea or vomiting. No fever or chills.No melena or hematochezia.No bleeding.No claudication. Feels flushed at times  Physical Exam: BP 176/98  Pulse 66  Ht 6' (1.829 m)  Wt 254 lb (115.214 kg)  BMI 34.45 kg/m2 General: Well-nourished Neck: Normal carotid upstroke Lungs: Clear Cardiac: Regular rate and rhythm normal S1-S2 Vascular: No edema Skin: Warm and dry  12lead ECG: Not available Limited bedside ECHO:N/A   Assessment and Plan

## 2011-07-18 ENCOUNTER — Ambulatory Visit (INDEPENDENT_AMBULATORY_CARE_PROVIDER_SITE_OTHER): Payer: Medicare Other | Admitting: Physician Assistant

## 2011-07-18 ENCOUNTER — Encounter: Payer: Self-pay | Admitting: Cardiology

## 2011-07-18 VITALS — BP 122/86 | HR 78 | Ht 72.0 in | Wt 257.0 lb

## 2011-07-18 DIAGNOSIS — E876 Hypokalemia: Secondary | ICD-10-CM

## 2011-07-18 DIAGNOSIS — I480 Paroxysmal atrial fibrillation: Secondary | ICD-10-CM

## 2011-07-18 DIAGNOSIS — I4891 Unspecified atrial fibrillation: Secondary | ICD-10-CM

## 2011-07-18 DIAGNOSIS — I10 Essential (primary) hypertension: Secondary | ICD-10-CM

## 2011-07-18 MED ORDER — DILTIAZEM HCL ER COATED BEADS 120 MG PO CP24: 120.0000 mg | ORAL_CAPSULE | Freq: Every day | ORAL | Status: DC

## 2011-07-18 MED ORDER — CLONIDINE HCL 0.2 MG PO TABS: 0.2000 mg | ORAL_TABLET | Freq: Every day | ORAL | Status: DC

## 2011-07-18 MED ORDER — ENALAPRIL MALEATE 20 MG PO TABS: 20.0000 mg | ORAL_TABLET | Freq: Two times a day (BID) | ORAL | Status: DC

## 2011-07-18 MED ORDER — FLECAINIDE ACETATE 150 MG PO TABS: 300.0000 mg | ORAL_TABLET | ORAL | Status: DC | PRN

## 2011-07-18 MED ORDER — CHLORTHALIDONE 25 MG PO TABS: 25.0000 mg | ORAL_TABLET | Freq: Every day | ORAL | Status: DC

## 2011-07-18 MED ORDER — SPIRONOLACTONE 25 MG PO TABS: 12.5000 mg | ORAL_TABLET | Freq: Every day | ORAL | Status: DC

## 2011-07-18 NOTE — Assessment & Plan Note (Signed)
Marked improvement, since last OV. Continue current regimen, including addition of Aldactone. Will check BMET to exclude hypokalemia, in light of complaint of bilateral leg discomfort/possible cramping.

## 2011-07-18 NOTE — Assessment & Plan Note (Addendum)
Maintaining NSR on current medication regimen, which includes when prn use of flecainide. Of note, patient has a current CHADS score of  1 (HTN), and is on full dose aspirin, although he does not take this regularly. Following discussion with Dr. Andee Lineman, plan is to continue full dose aspirin, to be taken daily, with consideration for Coumadin anticoagulation, when he reaches age 71.

## 2011-07-18 NOTE — Patient Instructions (Signed)
Your physician recommends that you go to the [facility] lab for blood work for [employer]. If the results of your test are normal or stable, you will receive a letter.  If they are abnormal, the nurse will contact you by phone. Your physician wants you to follow up in: 6 months.  You will receive a reminder letter in the mail one-two months in advance.  If you don't receive a letter, please call our office to schedule the follow up appointment

## 2011-07-18 NOTE — Progress Notes (Addendum)
HPI: Patient presents for scheduled followup.  When last seen, 11/12, by Dr. Andee Lineman, patient was started on Aldactone 12.5 mg daily for aggressive BP control. Followup labs were stable, and TSH level was normal.  Patient reports 2 isolated episodes of tachycardia palpitations, since last OV. Each each time he took 300 mg Flecainide, as previously instructed. Of note, he denies any significant associated symptoms with these episodes.  Patient reports interim development of lower back/bilateral leg discomfort, upon awakening. Although he denies sensation of "cramps", he notes that this began after addition of Aldactone and resolves during early morning hours.  Patient denies any history of exertional chest pain.  Allergies  Allergen Reactions  . Ketorolac Tromethamine   . Meperidine Hcl     REACTION: hyper/hallucinate    Current Outpatient Prescriptions  Medication Sig Dispense Refill  . aspirin 325 MG tablet Take 325 mg by mouth daily as needed.       . chlorthalidone (HYGROTON) 25 MG tablet Take 1 tablet (25 mg total) by mouth daily.  30 tablet  6  . cloNIDine (CATAPRES) 0.2 MG tablet Take 1 tablet (0.2 mg total) by mouth at bedtime.  30 tablet  6  . diltiazem (CARDIZEM CD) 120 MG 24 hr capsule Take 1 capsule (120 mg total) by mouth daily.  30 capsule  6  . enalapril (VASOTEC) 20 MG tablet Take 1 tablet (20 mg total) by mouth 2 (two) times daily.  60 tablet  6  . flecainide (TAMBOCOR) 150 MG tablet Take 300 mg by mouth as needed. As needed       . fluticasone (FLONASE) 50 MCG/ACT nasal spray Place 2 sprays into the nose daily.        . ranitidine (ZANTAC) 150 MG tablet Take 150 mg by mouth daily as needed.        Marland Kitchen spironolactone (ALDACTONE) 25 MG tablet Take 0.5 tablets (12.5 mg total) by mouth daily.  15 tablet  6    Past Medical History  Diagnosis Date  . Hypertension   . Prostate cancer 2009    Status post robotic radical prostatectomy in 2009  . Atrial fibrillation   .  Headaches, cluster   . Cardiac dysrhythmia, unspecified   . Encounter for long-term (current) use of aspirin     Past Surgical History  Procedure Date  . Total knee arthroplasty 2006  . Cholecystectomy   . Inguinal hernia repair     RIGHT    History   Social History  . Marital Status: Widowed    Spouse Name: N/A    Number of Children: N/A  . Years of Education: N/A   Occupational History  . RETIRED    Social History Main Topics  . Smoking status: Never Smoker   . Smokeless tobacco: Never Used  . Alcohol Use: No  . Drug Use: No  . Sexually Active: Not on file   Other Topics Concern  . Not on file   Social History Narrative   Last updated: 10/18/2011Widowed Tobacco Use - No. Alcohol Use - yes on occasionRetired from [employer]    No family history on file.  ROS: no nausea, vomiting; no fever, chills; no melena, hematochezia; no claudication  PHYSICAL EXAM: BP 122/86  Pulse 78  Ht 6' (1.829 m)  Wt 257 lb (116.574 kg)  BMI 34.86 kg/m2 GENERAL: 71 year old male; NAD HEENT: NCAT, PERRLA, EOMI; sclera clear; no xanthelasma NECK: palpable bilateral carotid pulses, no bruits; no JVD; no TM LUNGS: CTA bilaterally CARDIAC:  RRR (S1, S2); no significant murmurs; no rubs or gallops ABDOMEN: Protuberant EXTREMETIES: no significant peripheral edema SKIN: warm/dry; no obvious rash/lesions MUSCULOSKELETAL: no joint deformity NEURO: no focal deficit; NL affect   EKG: NSR at 71 bpm; LAD/LAHB; no ischemic changes   ASSESSMENT & PLAN:  No problem-specific assessment & plan notes found for this encounter.   Gene Anthonella Klausner, PAC  Patient seen and examined with Gene Avyana Puffenbarger, PA-C.  Counseling was provided regarding the current medical condition and included: . Diagnosis, impressions, prognosis, recommended diagnostic studies  . Risks and benefits of treatment options  . Instructions for management, treatment and/or follow-up care  . Importance of compliance with treatment,  risk factor reduction  . Patient and/or family education    Time spent counseling was 15 minutes and recorded in the Problem List documeted by Gene Ziare Cryder , PA-C   Alvin Critchley [facility] 07/18/2011 12:44 PM

## 2011-07-22 ENCOUNTER — Encounter: Payer: Self-pay | Admitting: *Deleted

## 2011-10-21 ENCOUNTER — Other Ambulatory Visit: Payer: Self-pay | Admitting: Neurology

## 2011-10-21 DIAGNOSIS — D320 Benign neoplasm of cerebral meninges: Secondary | ICD-10-CM

## 2011-10-21 DIAGNOSIS — D329 Benign neoplasm of meninges, unspecified: Secondary | ICD-10-CM

## 2011-11-02 ENCOUNTER — Other Ambulatory Visit: Payer: Medicare Other

## 2012-01-11 ENCOUNTER — Other Ambulatory Visit: Payer: Self-pay | Admitting: Cardiology

## 2012-01-16 ENCOUNTER — Ambulatory Visit (INDEPENDENT_AMBULATORY_CARE_PROVIDER_SITE_OTHER): Payer: Medicare Other | Admitting: Cardiology

## 2012-01-16 ENCOUNTER — Encounter: Payer: Self-pay | Admitting: Cardiology

## 2012-01-16 VITALS — BP 127/83 | HR 65 | Ht 73.0 in | Wt 257.8 lb

## 2012-01-16 DIAGNOSIS — I2510 Atherosclerotic heart disease of native coronary artery without angina pectoris: Secondary | ICD-10-CM

## 2012-01-16 DIAGNOSIS — I4891 Unspecified atrial fibrillation: Secondary | ICD-10-CM

## 2012-01-16 DIAGNOSIS — I480 Paroxysmal atrial fibrillation: Secondary | ICD-10-CM

## 2012-01-16 DIAGNOSIS — I10 Essential (primary) hypertension: Secondary | ICD-10-CM

## 2012-01-16 NOTE — Progress Notes (Signed)
HPI The patient presents for evaluation of atrial fibrillation. He has had paroxysmal atrial fibrillation and treated with "pill in pocket" flecaininde. He's had the use this about 3 times in the past year. He says he works very well. His symptoms resolved fairly soon after he takes it. He does not get any chest discomfort, neck or arm discomfort. He is not getting any shortness of breath, PND or orthopnea. He's had no weight gain or edema. He was noted recently to have a very mildly elevated TSH and I reviewed his labs. He doesn't exercise routinely. He does have some mild knee pain that limits him.  Allergies  Allergen Reactions  . Ketorolac Tromethamine   . Meperidine Hcl     REACTION: hyper/hallucinate    Current Outpatient Prescriptions  Medication Sig Dispense Refill  . aspirin 325 MG tablet Take 325 mg by mouth daily as needed.       Marland Kitchen CARDIZEM CD 120 MG 24 hr capsule TAKE 1 CAPSULE BY MOUTH ONCE A DAY.  30 capsule  3  . chlorthalidone (HYGROTON) 25 MG tablet Take 12.5 mg by mouth daily.      . cloNIDine (CATAPRES) 0.2 MG tablet Take 1 tablet (0.2 mg total) by mouth at bedtime.  30 tablet  6  . enalapril (VASOTEC) 20 MG tablet Take 1 tablet (20 mg total) by mouth 2 (two) times daily.  60 tablet  6  . flecainide (TAMBOCOR) 150 MG tablet Take 2 tablets (300 mg total) by mouth as needed. As needed  30 tablet  2  . fluticasone (FLONASE) 50 MCG/ACT nasal spray Place 2 sprays into the nose daily.        . ranitidine (ZANTAC) 150 MG tablet Take 150 mg by mouth daily as needed.        Marland Kitchen spironolactone (ALDACTONE) 25 MG tablet Take 0.5 tablets (12.5 mg total) by mouth daily.  15 tablet  6  . [DISCONTINUED] chlorthalidone (HYGROTON) 25 MG tablet Take 1 tablet (25 mg total) by mouth daily.  30 tablet  6    Past Medical History  Diagnosis Date  . Hypertension   . Prostate cancer 2009    Status post robotic radical prostatectomy in 2009  . Atrial fibrillation     EF 60-65%, 2-D echo, 7/11   . Headaches, cluster   . Cardiac dysrhythmia, unspecified   . Encounter for long-term (current) use of aspirin     Past Surgical History  Procedure Date  . Total knee arthroplasty 2006  . Cholecystectomy   . Inguinal hernia repair     RIGHT  . Prostatectomy     Radiation therapy    ROS:  As stated in the HPI and negative for all other systems.  PHYSICAL EXAM BP 127/83  Pulse 65  Ht 6\' 1"  (1.854 m)  Wt 257 lb 12.8 oz (116.937 kg)  BMI 34.01 kg/m2 GENERAL:  Well appearing HEENT:  Pupils equal round and reactive, fundi not visualized, oral mucosa unremarkable NECK:  No jugular venous distention, waveform within normal limits, carotid upstroke brisk and symmetric, no bruits, no thyromegaly LYMPHATICS:  No cervical, inguinal adenopathy LUNGS:  Clear to auscultation bilaterally BACK:  No CVA tenderness CHEST:  Unremarkable HEART:  PMI not displaced or sustained,S1 and S2 within normal limits, no S3, no S4, no clicks, no rubs, no murmurs ABD:  Flat, positive bowel sounds normal in frequency in pitch, no bruits, no rebound, no guarding, no midline pulsatile mass, no hepatomegaly, no splenomegaly, obese  EXT:  2 plus pulses throughout, no edema, no cyanosis no clubbing SKIN:  No rashes no nodules NEURO:  Cranial nerves II through XII grossly intact, motor grossly intact throughout PSYCH:  Cognitively intact, oriented to person place and time  EKG:  Normal sinus rhythm, rate 65, leftward axis, poor anterior R wave progression, no acute ST-T wave changes. 01/16/2012   ASSESSMENT AND PLAN  Atrial fibrillation - This is paroxysmal and managed well with the as needed flecainide. No change in therapy is indicated.  HTN - Blood pressure is well-controlled he will continue the meds as listed.  Obesity - The patient understands the need to lose weight with diet and exercise. We have discussed specific strategies for this.  (Note previous notes mention coronary disease but the  patient denies any history of this and has never had a cardiac catheterization. He says he's had a negative stress test in the past. I reviewed all her previous records to confirm this.)

## 2012-01-16 NOTE — Patient Instructions (Addendum)

## 2012-08-03 ENCOUNTER — Ambulatory Visit (INDEPENDENT_AMBULATORY_CARE_PROVIDER_SITE_OTHER): Payer: Medicare Other | Admitting: Cardiology

## 2012-08-03 ENCOUNTER — Encounter: Payer: Self-pay | Admitting: Cardiology

## 2012-08-03 VITALS — BP 157/101 | HR 66 | Ht 73.0 in | Wt 260.0 lb

## 2012-08-03 DIAGNOSIS — I4891 Unspecified atrial fibrillation: Secondary | ICD-10-CM

## 2012-08-03 DIAGNOSIS — I480 Paroxysmal atrial fibrillation: Secondary | ICD-10-CM

## 2012-08-03 MED ORDER — ENALAPRIL MALEATE 10 MG PO TABS: 10.0000 mg | ORAL_TABLET | Freq: Two times a day (BID) | ORAL | Status: DC

## 2012-08-03 NOTE — Patient Instructions (Addendum)
   Begin Vasotec 10mg  twice a day   Continue all other current medications.  Your physician wants you to follow up in:  1 year.  You will receive a reminder letter in the mail one-two months in advance.  If you don't receive a letter, please call our office to schedule the follow up appointment

## 2012-08-03 NOTE — Progress Notes (Signed)
HPI The patient presents for evaluation of atrial fibrillation. He has had paroxysmal atrial fibrillation and treated with "pill in pocket" flecaininde. He has not had any recent palpitations.   He does not get any chest discomfort, neck or arm discomfort. He is not getting any shortness of breath, PND or orthopnea. He's had no weight gain or edema. He was noted recently to have a very mildly elevated TSH and was recently started on Synthroid. He took himself off of clonidine, Vasotec, Hygronten, and spironolactone since I saw him because he thought they were causing fatigue.  Unfortunately he fell in January and hurt his knee. He is being considered for arthroscopic surgery and he is sent here preoperatively.  Allergies  Allergen Reactions  . Ketorolac Tromethamine   . Meperidine Hcl     REACTION: hyper/hallucinate  . Zithromax (Azithromycin)     Current Outpatient Prescriptions  Medication Sig Dispense Refill  . aspirin 325 MG tablet Take 325 mg by mouth daily as needed.       Marland Kitchen CARDIZEM CD 120 MG 24 hr capsule TAKE 1 CAPSULE BY MOUTH ONCE A DAY.  30 capsule  3  . flecainide (TAMBOCOR) 150 MG tablet Take 2 tablets (300 mg total) by mouth as needed. As needed  30 tablet  2  . fluticasone (FLONASE) 50 MCG/ACT nasal spray Place 2 sprays into the nose daily.        Marland Kitchen levothyroxine (SYNTHROID, LEVOTHROID) 25 MCG tablet Take 25 mcg by mouth daily before breakfast.      . ranitidine (ZANTAC) 150 MG tablet Take 150 mg by mouth daily as needed.         No current facility-administered medications for this visit.    Past Medical History  Diagnosis Date  . Hypertension   . Prostate cancer 2009    Status post robotic radical prostatectomy in 2009  . Atrial fibrillation     EF 60-65%, 2-D echo, 7/11  . Headaches, cluster   . Cardiac dysrhythmia, unspecified   . Encounter for long-term (current) use of aspirin     Past Surgical History  Procedure Laterality Date  . Total knee  arthroplasty  2006  . Cholecystectomy    . Inguinal hernia repair      RIGHT  . Prostatectomy      Radiation therapy    ROS:  As stated in the HPI and negative for all other systems.  PHYSICAL EXAM BP 157/101  Pulse 66  Ht 6\' 1"  (1.854 m)  Wt 260 lb (117.935 kg)  BMI 34.31 kg/m2 GENERAL:  Well appearing HEENT:  Pupils equal round and reactive, fundi not visualized, oral mucosa unremarkable NECK:  No jugular venous distention, waveform within normal limits, carotid upstroke brisk and symmetric, no bruits, no thyromegaly LYMPHATICS:  No cervical, inguinal adenopathy LUNGS:  Clear to auscultation bilaterally BACK:  No CVA tenderness CHEST:  Unremarkable HEART:  PMI not displaced or sustained,S1 and S2 within normal limits, no S3, no S4, no clicks, no rubs, no murmurs ABD:  Flat, positive bowel sounds normal in frequency in pitch, no bruits, no rebound, no guarding, no midline pulsatile mass, no hepatomegaly, no splenomegaly, obese EXT:  2 plus pulses throughout, no edema, no cyanosis no clubbing SKIN:  No rashes no nodules NEURO:  Cranial nerves II through XII grossly intact, motor grossly intact throughout PSYCH:  Cognitively intact, oriented to person place and time  EKG:  Normal sinus rhythm, rate 68  leftward axis, poor anterior R wave  progression, no acute ST-T wave changes. 08/03/2012   ASSESSMENT AND PLAN  Preop - He has no high risk symptoms or findings. He has a moderate functional level and is going for a low risk procedure.  Therefore, based on ACC/AHA guidelines, the patient would be at acceptable risk for the planned procedure without further cardiovascular testing.  Atrial fibrillation - This is paroxysmal and managed well with the as needed flecainide. No change in therapy is indicated.  HTN - I'm going to restart his enalapril 10 mg twice a day. He will remain off the other medications that he stopped.  However, he may need further med titration.  Obesity  - Hopefully he can do better with this when he is able to exercise.

## 2012-08-21 ENCOUNTER — Encounter: Payer: Self-pay | Admitting: Cardiology

## 2012-08-21 ENCOUNTER — Encounter: Payer: Self-pay | Admitting: Physician Assistant

## 2012-08-21 DIAGNOSIS — I4891 Unspecified atrial fibrillation: Secondary | ICD-10-CM

## 2012-08-21 DIAGNOSIS — R000 Tachycardia, unspecified: Secondary | ICD-10-CM

## 2012-08-22 ENCOUNTER — Encounter: Payer: Self-pay | Admitting: Physician Assistant

## 2012-08-22 NOTE — Progress Notes (Signed)
Patient was briefly hospitalized at [facility] with PAF RVR. He took a total of 300 mg flecainide at home, with no palpable conversion to NSR. On presentation to the ED, EKG confirmed AF ~115 bpm. He subsequently spontaneously converted to NSR approximately 18 hours after initial onset, and before he received a 100 mg dose of flecainide in the hospital. Our initial plan was to treat with flecainide 100 twice a day and proceed with DCCV the following day, if he remained in AF. We also initiated Xarelto anticoagulation at 20 mg daily.  The patient remained in NSR the following day, but was reluctant to proceed with Xarelto and flecainide 100 mg bid as an outpatient, as we had planned. Following review with Dr. Myrtis Ser, therefore, final plan was to discharge him on his previous home regimen of prn flecainide and ASA 325 daily. He was quite pleased with this final recommendation, and will followup with Dr. Antoine Poche for further recommendations.

## 2012-09-03 ENCOUNTER — Encounter: Payer: Medicare Other | Admitting: Cardiology

## 2012-09-03 ENCOUNTER — Ambulatory Visit (INDEPENDENT_AMBULATORY_CARE_PROVIDER_SITE_OTHER): Payer: Medicare Other | Admitting: Cardiology

## 2012-09-03 ENCOUNTER — Encounter: Payer: Self-pay | Admitting: Cardiology

## 2012-09-03 VITALS — BP 171/96 | HR 73 | Ht 73.0 in | Wt 251.8 lb

## 2012-09-03 DIAGNOSIS — I2510 Atherosclerotic heart disease of native coronary artery without angina pectoris: Secondary | ICD-10-CM

## 2012-09-03 DIAGNOSIS — I480 Paroxysmal atrial fibrillation: Secondary | ICD-10-CM

## 2012-09-03 DIAGNOSIS — I4891 Unspecified atrial fibrillation: Secondary | ICD-10-CM

## 2012-09-03 MED ORDER — AMLODIPINE BESYLATE 5 MG PO TABS: 5.0000 mg | ORAL_TABLET | Freq: Every day | ORAL | Status: DC

## 2012-09-03 NOTE — Progress Notes (Signed)
HPI The patient presents for evaluation of atrial fibrillation. He has had paroxysmal atrial fibrillation and treated with "pill in pocket" flecaininde.   He presents for followup after recent hospitalization for another bout of atrial fibrillation. He was admitted after his fibrillation persisted despite 300 mg of flecainide at home. During his hospitalization he received another 100 mg dose. Initially the thought was that he might need cardioversion but he converted on his own. He did not want to take Xarelto at that time and also did not want to start taking flecainide daily. Of note he remains on Cardizem CD though he says he gets sinus congestion and a runny nose with this. He has a much more severe reaction with IV Cardizem and I have not added this as an allergy.  Since I last saw him he has been recovering from arthroscopic knee surgery and he's been having a bit of around time of this. He's starting to do a little rehabilitation but this is lower than he thought. The patient denies any new symptoms such as chest discomfort, neck or arm discomfort. There has been no new shortness of breath, PND or orthopnea. There has been no presyncope or syncope.   Allergies  Allergen Reactions  . Cardizem (Diltiazem Hcl)   . Ketorolac Tromethamine   . Meperidine Hcl     REACTION: hyper/hallucinate  . Zithromax (Azithromycin)     Current Outpatient Prescriptions  Medication Sig Dispense Refill  . aspirin 325 MG tablet Take 325 mg by mouth daily as needed.       . calcium carbonate (TUMS - DOSED IN MG ELEMENTAL CALCIUM) 500 MG chewable tablet Chew 1 tablet by mouth as needed for heartburn.      Marland Kitchen CARDIZEM CD 120 MG 24 hr capsule TAKE 1 CAPSULE BY MOUTH ONCE A DAY.  30 capsule  3  . cetirizine (ZYRTEC) 10 MG tablet Take 10 mg by mouth as needed for allergies.      Marland Kitchen enalapril (VASOTEC) 10 MG tablet Take 1 tablet (10 mg total) by mouth 2 (two) times daily.  60 tablet  6  . flecainide (TAMBOCOR) 150  MG tablet Take 2 tablets (300 mg total) by mouth as needed. As needed  30 tablet  2  . fluticasone (FLONASE) 50 MCG/ACT nasal spray Place 2 sprays into the nose daily.        Marland Kitchen levothyroxine (SYNTHROID, LEVOTHROID) 25 MCG tablet Take 25 mcg by mouth daily before breakfast.      . ranitidine (ZANTAC) 150 MG tablet Take 150 mg by mouth daily as needed.         No current facility-administered medications for this visit.    Past Medical History  Diagnosis Date  . Hypertension   . Prostate cancer 2009    Status post robotic radical prostatectomy in 2009  . Atrial fibrillation     EF 60-65%, 2-D echo, 7/11  . Headaches, cluster   . Cardiac dysrhythmia, unspecified   . Encounter for long-term (current) use of aspirin     Past Surgical History  Procedure Laterality Date  . Total knee arthroplasty  2006  . Cholecystectomy    . Inguinal hernia repair      RIGHT  . Prostatectomy      Radiation therapy    ROS:  As stated in the HPI and negative for all other systems.  PHYSICAL EXAM BP 171/96  Pulse 73  Ht 6\' 1"  (1.854 m)  Wt 251 lb 12.8 oz (114.216  kg)  BMI 33.23 kg/m2 GENERAL:  Well appearing HEENT:  Pupils equal round and reactive, fundi not visualized, oral mucosa unremarkable NECK:  No jugular venous distention, waveform within normal limits, carotid upstroke brisk and symmetric, no bruits, no thyromegaly LYMPHATICS:  No cervical, inguinal adenopathy LUNGS:  Clear to auscultation bilaterally BACK:  No CVA tenderness CHEST:  Unremarkable HEART:  PMI not displaced or sustained,S1 and S2 within normal limits, no S3, no S4, no clicks, no rubs, no murmurs ABD:  Flat, positive bowel sounds normal in frequency in pitch, no bruits, no rebound, no guarding, no midline pulsatile mass, no hepatomegaly, no splenomegaly, obese EXT:  2 plus pulses throughout, no edema, no cyanosis no clubbing SKIN:  No rashes no nodules NEURO:  Cranial nerves II through XII grossly intact, motor grossly  intact throughout PSYCH:  Cognitively intact, oriented to person place and time  EKG:  Normal sinus rhythm, rate 68  leftward axis, poor anterior R wave progression, no acute ST-T wave changes. 09/03/2012   ASSESSMENT AND PLAN   Atrial fibrillation - Mr. DOLPH TAVANO has a CHA2DS2 - VASc score of 2 with a risk of stroke of 2.2%  and a HAS - BLED score of 2 with a low risk of bleeding.  However, with this information he does not want to start anticoagulation. He does not want to start daily flecainide but we will we will have this conversation again if he has recurrent atrial fibrillation.  HTN - He does not feel good on a Cardizem. Therefore, I will stop this and start Norvasc 5 mg daily which he tolerated previously. He understands that if he has atrial fibrillation he might go faster without Cardizem  Obesity - Hopefully he can do better with this when he is able to exercise.

## 2012-09-03 NOTE — Patient Instructions (Addendum)
Your physician recommends that you schedule a follow-up appointment in: 6 months. You will receive a reminder letter in the mail in about 4 months reminding you to call and schedule your appointment. If you don't receive this letter, please contact our office. Your physician has recommended you make the following change in your medication: Stop cardizem and start amlodipine 5 mg daily. Your new prescription has been sent to your pharmacy. All other medications will remain the same.

## 2012-12-06 IMAGING — CR DG ABDOMEN 1V
1 series · 1 of 1 positions shown · non-contrast
Comparison: 10/02/2010.

CLINICAL DATA: Abdominal pain with nausea, vomiting, distention and
constipation.

ABDOMEN - 1 VIEW

[t abdomen supine]
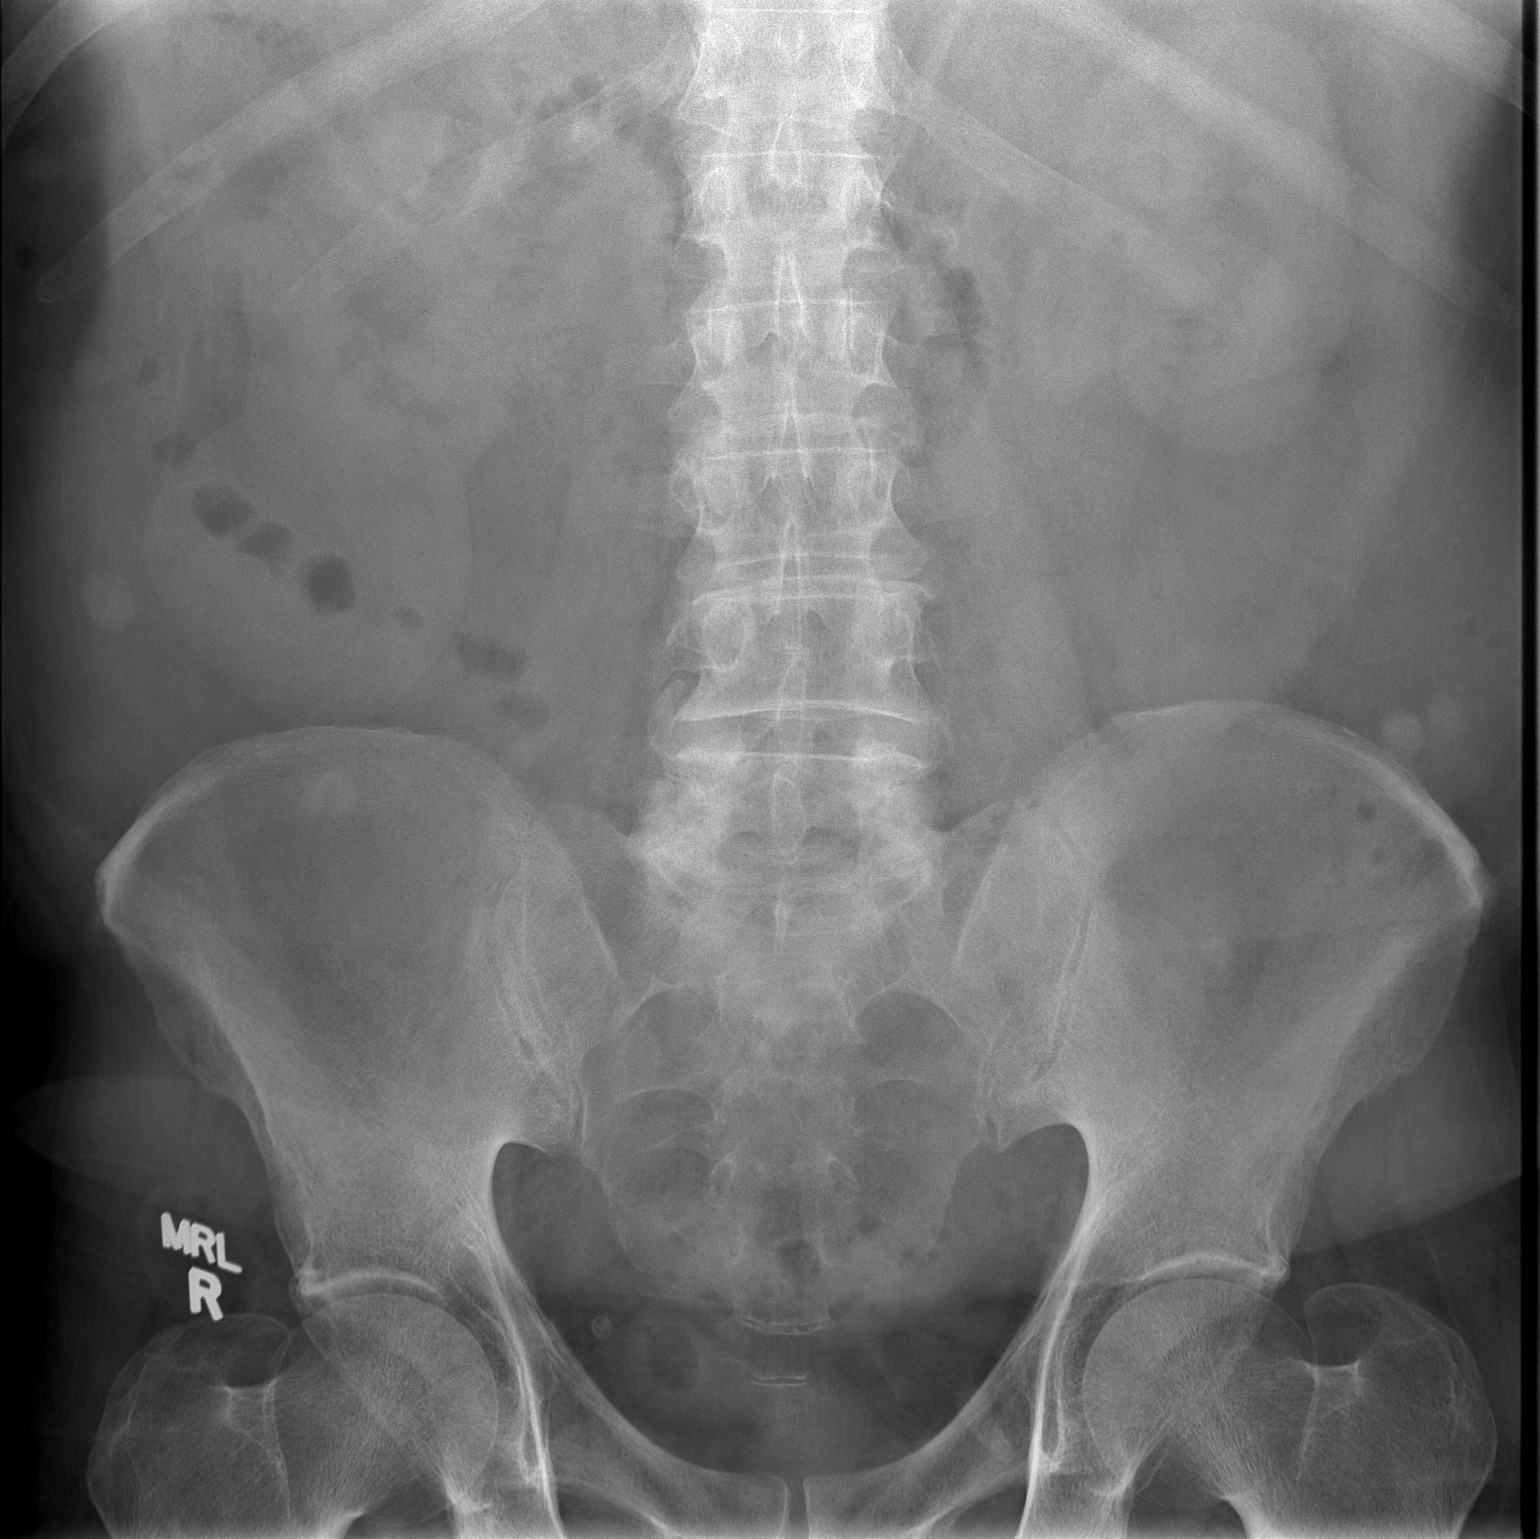

[1 of 1 positions shown; findings below may reference images not displayed]

FINDINGS: There has been interval passage of oral contrast from the
colon, with a few scattered diverticula.  Visualized.  No small
bowel dilatation.
IMPRESSION: No acute findings.

## 2013-02-06 ENCOUNTER — Ambulatory Visit (INDEPENDENT_AMBULATORY_CARE_PROVIDER_SITE_OTHER): Payer: Medicare Other | Admitting: Cardiology

## 2013-02-06 ENCOUNTER — Encounter: Payer: Self-pay | Admitting: Cardiology

## 2013-02-06 VITALS — BP 151/100 | HR 87 | Ht 73.0 in | Wt 262.0 lb

## 2013-02-06 DIAGNOSIS — I4891 Unspecified atrial fibrillation: Secondary | ICD-10-CM

## 2013-02-06 DIAGNOSIS — I480 Paroxysmal atrial fibrillation: Secondary | ICD-10-CM

## 2013-02-06 MED ORDER — ENALAPRIL MALEATE 5 MG PO TABS: 5.0000 mg | ORAL_TABLET | Freq: Two times a day (BID) | ORAL | Status: DC

## 2013-02-06 MED ORDER — FLECAINIDE ACETATE 150 MG PO TABS: 300.0000 mg | ORAL_TABLET | ORAL | Status: AC | PRN

## 2013-02-06 NOTE — Progress Notes (Signed)
HPI The patient presents for evaluation of atrial fibrillation. He has had paroxysmal atrial fibrillation and treated with "pill in pocket" flecaininde.   Since I last saw him he had probably one episode of this summer [employer]. He did stop Cardizem at the last visit because he thought it made him had headaches. He does feel much better without this. However, he also stopped his ACE inhibitor as he thought it was causing muscle aches. I reviewed Vincent Moran blood pressure diary today and his blood pressures had been slightly above target. He still having knee problems and not as active as I would like. His weight is up a little bit. However, he's not having headaches. He denies chest pressure, neck or arm discomfort. He's not had any new shortness of breath, PND or orthopnea.   Allergies  Allergen Reactions  . Cardizem [Diltiazem]     IV   . Ketorolac Tromethamine   . Meperidine Hcl     REACTION: hyper/hallucinate  . Zithromax [Azithromycin]     Current Outpatient Prescriptions  Medication Sig Dispense Refill  . amLODipine (NORVASC) 5 MG tablet Take 1 tablet (5 mg total) by mouth daily.  180 tablet  3  . aspirin 325 MG tablet Take 325 mg by mouth daily as needed.       . calcium carbonate (TUMS - DOSED IN MG ELEMENTAL CALCIUM) 500 MG chewable tablet Chew 1 tablet by mouth as needed for heartburn.      . cetirizine (ZYRTEC) 10 MG tablet Take 10 mg by mouth as needed for allergies.      . flecainide (TAMBOCOR) 150 MG tablet Take 2 tablets (300 mg total) by mouth as needed. As needed  30 tablet  2  . fluticasone (FLONASE) 50 MCG/ACT nasal spray Place 2 sprays into the nose daily.        Marland Kitchen levothyroxine (SYNTHROID, LEVOTHROID) 25 MCG tablet Take 50 mcg by mouth daily before breakfast.       . ranitidine (ZANTAC) 150 MG tablet Take 150 mg by mouth daily as needed.        . enalapril (VASOTEC) 10 MG tablet Take 1 tablet (10 mg total) by mouth 2 (two) times daily.  60 tablet  6   No current  facility-administered medications for this visit.    Past Medical History  Diagnosis Date  . Hypertension   . Prostate cancer 2009    Status post robotic radical prostatectomy in 2009  . Atrial fibrillation     EF 60-65%, 2-D echo, 7/11  . Headaches, cluster   . Cardiac dysrhythmia, unspecified   . Encounter for long-term (current) use of aspirin     Past Surgical History  Procedure Laterality Date  . Total knee arthroplasty  2006  . Cholecystectomy    . Inguinal hernia repair      RIGHT  . Prostatectomy      Radiation therapy    ROS:  As stated in the HPI and negative for all other systems.  PHYSICAL EXAM BP 151/100  Pulse 87  Ht 6\' 1"  (1.854 m)  Wt 262 lb (118.842 kg)  BMI 34.57 kg/m2 GENERAL:  Well appearing NECK:  No jugular venous distention, waveform within normal limits, carotid upstroke brisk and symmetric, no bruits, no thyromegaly LUNGS:  Clear to auscultation bilaterally BACK:  No CVA tenderness CHEST:  Unremarkable HEART:  PMI not displaced or sustained,S1 and S2 within normal limits, no S3, no S4, no clicks, no rubs, no murmurs ABD:  Flat,  positive bowel sounds normal in frequency in pitch, no bruits, no rebound, no guarding, no midline pulsatile mass, no hepatomegaly, no splenomegaly, obese EXT:  2 plus pulses throughout, no edema, no cyanosis no clubbing   EKG:  Normal sinus rhythm, rate 79 leftward axis, poor anterior R wave progression, no acute ST-T wave changes. 02/06/2013   ASSESSMENT AND PLAN   Atrial fibrillation - Vincent Moran has a CHA2DS2 - VASc score of 2 with a risk of stroke of 2.2%  and a HAS - BLED score of 2 with a low risk of bleeding.  However, with this information he has not wanted to start anticoagulation. He has not wanted to start daily flecainide   HTN - He was encouraged not to stop his medications without discussing with [place]. He will restart his ACE inhibitor at 5 mg twice a day  Obesity - He has been educated in the  past that his weight continues to go up.

## 2013-02-06 NOTE — Patient Instructions (Addendum)
Please restart Enalapril 5 mg twice a day. Continue all other medications as listed.  Follow up in 1 year with Dr Antoine Poche.  You will receive a letter in the mail 2 months before you are due.  Please call us when you receive this letter to schedule your follow up appointment.

## 2013-08-27 ENCOUNTER — Encounter: Payer: Self-pay | Admitting: Cardiology

## 2013-08-27 ENCOUNTER — Ambulatory Visit (INDEPENDENT_AMBULATORY_CARE_PROVIDER_SITE_OTHER): Payer: Medicare Other | Admitting: Cardiology

## 2013-08-27 VITALS — BP 148/93 | HR 73 | Ht 73.0 in | Wt 258.0 lb

## 2013-08-27 DIAGNOSIS — I4891 Unspecified atrial fibrillation: Secondary | ICD-10-CM

## 2013-08-27 DIAGNOSIS — R5383 Other fatigue: Secondary | ICD-10-CM

## 2013-08-27 DIAGNOSIS — R5381 Other malaise: Secondary | ICD-10-CM

## 2013-08-27 DIAGNOSIS — I10 Essential (primary) hypertension: Secondary | ICD-10-CM

## 2013-08-27 DIAGNOSIS — I480 Paroxysmal atrial fibrillation: Secondary | ICD-10-CM

## 2013-08-27 NOTE — Patient Instructions (Signed)
Continue all current medications.  Your physician has requested that you regularly monitor and record your blood pressure readings at home. Please take approximately 1-2 hours after medication.  Bring readings bact to next office visit for MD review.   Follow up in  2 months

## 2013-08-27 NOTE — Progress Notes (Addendum)
Clinical Summary Vincent Moran is a 73 y.o.male last seen by Dr Percival Spanish, this is our first visit together. He is seen for the following medical problems.  1. Afib - treated with pill in pocket flecanide - he previously stopped cardizem because he thought it was causing headaches, symptoms improved after stopping - has refused anticoagulation in the past - no recent palpitations  2. HTN - home bp's 140-150s/78-90 - reports mixed compliance with meds. He reports one of his bp meds causes a headache but he is unsure which one. He is actually just taking enalapril 2.5mg  tid instead of the prescribed 5mg  bid.   3. Fatigue - low energy, fatigued for the last few weeks. Can have some daytime somnolence. No palpitations, no chest pain, no SOB or DOE.  Last TSH 6.3 from 08/13/13, reports mixed compliance with his synthroid  - never tested for sleep apnea  Past Medical History  Diagnosis Date  . Hypertension   . Prostate cancer 2009    Status post robotic radical prostatectomy in 2009  . Atrial fibrillation     EF 60-65%, 2-D echo, 7/11  . Headaches, cluster   . Cardiac dysrhythmia, unspecified   . Encounter for long-term (current) use of aspirin      Allergies  Allergen Reactions  . Cardizem [Diltiazem]     IV   . Ketorolac Tromethamine   . Meperidine Hcl     REACTION: hyper/hallucinate  . Zithromax [Azithromycin]      Current Outpatient Prescriptions  Medication Sig Dispense Refill  . amLODipine (NORVASC) 5 MG tablet Take 1 tablet (5 mg total) by mouth daily.  180 tablet  3  . aspirin 325 MG tablet Take 325 mg by mouth daily as needed.       . calcium carbonate (TUMS - DOSED IN MG ELEMENTAL CALCIUM) 500 MG chewable tablet Chew 1 tablet by mouth as needed for heartburn.      . cetirizine (ZYRTEC) 10 MG tablet Take 10 mg by mouth as needed for allergies.      Marland Kitchen enalapril (VASOTEC) 5 MG tablet Take 1 tablet (5 mg total) by mouth 2 (two) times daily.  180 tablet  3  .  flecainide (TAMBOCOR) 150 MG tablet Take 2 tablets (300 mg total) by mouth as needed. As needed  30 tablet  3  . fluticasone (FLONASE) 50 MCG/ACT nasal spray Place 2 sprays into the nose daily.        Marland Kitchen levothyroxine (SYNTHROID, LEVOTHROID) 25 MCG tablet Take 50 mcg by mouth daily before breakfast.       . ranitidine (ZANTAC) 150 MG tablet Take 150 mg by mouth daily as needed.         No current facility-administered medications for this visit.     Past Surgical History  Procedure Laterality Date  . Total knee arthroplasty  2006  . Cholecystectomy    . Inguinal hernia repair      RIGHT  . Prostatectomy      Radiation therapy     Allergies  Allergen Reactions  . Cardizem [Diltiazem]     IV   . Ketorolac Tromethamine   . Meperidine Hcl     REACTION: hyper/hallucinate  . Zithromax [Azithromycin]       No family history on file.   Social History Vincent Moran reports that he has never smoked. He has never used smokeless tobacco. Vincent Moran reports that he does not drink alcohol.   Review of Systems CONSTITUTIONAL: +  fatigue HEENT: Eyes: No visual loss, blurred vision, double vision or yellow sclerae.No hearing loss, sneezing, congestion, runny nose or sore throat.  SKIN: No rash or itching.  CARDIOVASCULAR: per HPI RESPIRATORY: No shortness of breath, cough or sputum.  GASTROINTESTINAL: No anorexia, nausea, vomiting or diarrhea. No abdominal pain or blood.  GENITOURINARY: No burning on urination, no polyuria NEUROLOGICAL: No headache, dizziness, syncope, paralysis, ataxia, numbness or tingling in the extremities. No change in bowel or bladder control.  MUSCULOSKELETAL: No muscle, back pain, joint pain or stiffness.  LYMPHATICS: No enlarged nodes. No history of splenectomy.  PSYCHIATRIC: No history of depression or anxiety.  ENDOCRINOLOGIC: No reports of sweating, cold or heat intolerance. No polyuria or polydipsia.  Marland Kitchen   Physical Examination p 73 bp 148/93 Wt 258 lbs  BMI 34 Gen: resting comfortably, no acute distress HEENT: no scleral icterus, pupils equal round and reactive, no palptable cervical adenopathy,  CV: RRR, no m/r/g, no JVD, no carotid bruits Resp: Clear to auscultation bilaterally GI: abdomen is soft, non-tender, non-distended, normal bowel sounds, no hepatosplenomegaly MSK: extremities are warm, no edema.  Skin: warm, no rash Neuro:  no focal deficits Psych: appropriate affect   Diagnostic Studies 08/2012 Echo LVEF 60%    Assessment and Plan  1. Afib - no current symptoms - he is very sensitive to multiple medications, pill in pock flecanide is currently working well for him. - he continues to refuse anticoagulation, continue to assess at follow ups  2. HTN - home bp's and clinic bp above goal - instructed to try taking enalapril as prescribed 5mg  bid and keeping bp log  3. Generalized Fatigue - likely related to his hypothyroidism, he is to follow up with his pcp. No associated cardiac symptoms.  - if symptoms persist after thyroid corrected, consider sleep apnea testing as he does have risk factors    F/u 2 months  Arnoldo Lenis, M.D., F.A.C.C.

## 2013-10-29 ENCOUNTER — Ambulatory Visit (INDEPENDENT_AMBULATORY_CARE_PROVIDER_SITE_OTHER): Payer: Medicare Other | Admitting: Cardiology

## 2013-10-29 ENCOUNTER — Encounter: Payer: Self-pay | Admitting: Cardiology

## 2013-10-29 VITALS — BP 174/95 | HR 76 | Ht 73.0 in | Wt 258.0 lb

## 2013-10-29 DIAGNOSIS — I480 Paroxysmal atrial fibrillation: Secondary | ICD-10-CM

## 2013-10-29 DIAGNOSIS — I4891 Unspecified atrial fibrillation: Secondary | ICD-10-CM

## 2013-10-29 DIAGNOSIS — I10 Essential (primary) hypertension: Secondary | ICD-10-CM

## 2013-10-29 MED ORDER — ENALAPRIL MALEATE 10 MG PO TABS: 10.0000 mg | ORAL_TABLET | Freq: Two times a day (BID) | ORAL | Status: AC

## 2013-10-29 NOTE — Progress Notes (Signed)
Clinical Summary Vincent Moran is a 73 y.o.male seen today for follow up of the following medical problems.   1. Afib  - treated with pill in pocket flecanide  - he previously stopped cardizem because he thought it was causing headaches, symptoms improved after stopping  - has refused anticoagulation in the past  - one episode of palpitations in July, took flecanide and resolved.   2. HTN  - home bp's range from 120-140s/50-80s.  - reports mixed compliance with meds.  - describes some swelling and fatigue he attributes to norvasc.  3. Fatigue  - symptoms have improved, but not resolved, with improved thyroid function, repeat TSH has normalized at 1.45 - we have discussed his risk for OSA in the past, however he has not been interested in sleep testing.    Past Medical History  Diagnosis Date  . Hypertension   . Prostate cancer 2009    Status post robotic radical prostatectomy in 2009  . Atrial fibrillation     EF 60-65%, 2-D echo, 7/11  . Headaches, cluster   . Cardiac dysrhythmia, unspecified   . Encounter for long-term (current) use of aspirin      Allergies  Allergen Reactions  . Cardizem [Diltiazem]     IV   . Ketorolac Tromethamine   . Meperidine Hcl     REACTION: hyper/hallucinate  . Zithromax [Azithromycin]      Current Outpatient Prescriptions  Medication Sig Dispense Refill  . amLODipine (NORVASC) 5 MG tablet Take 1 tablet (5 mg total) by mouth daily.  180 tablet  3  . aspirin 325 MG tablet Take 325 mg by mouth daily as needed.       . calcium carbonate (TUMS - DOSED IN MG ELEMENTAL CALCIUM) 500 MG chewable tablet Chew 1 tablet by mouth as needed for heartburn.      . enalapril (VASOTEC) 5 MG tablet Take 2.5 mg by mouth 3 (three) times daily.      . flecainide (TAMBOCOR) 150 MG tablet Take 2 tablets (300 mg total) by mouth as needed. As needed  30 tablet  3  . levothyroxine (SYNTHROID, LEVOTHROID) 50 MCG tablet Take 1 tablet by mouth daily.       No  current facility-administered medications for this visit.     Past Surgical History  Procedure Laterality Date  . Total knee arthroplasty  2006  . Cholecystectomy    . Inguinal hernia repair      RIGHT  . Prostatectomy      Radiation therapy     Allergies  Allergen Reactions  . Cardizem [Diltiazem]     IV   . Ketorolac Tromethamine   . Meperidine Hcl     REACTION: hyper/hallucinate  . Zithromax [Azithromycin]       No family history on file.   Social History Vincent Moran reports that he has never smoked. He has never used smokeless tobacco. Vincent Moran reports that he does not drink alcohol.   Review of Systems CONSTITUTIONAL:mild fatigue HEENT: Eyes: No visual loss, blurred vision, double vision or yellow sclerae.No hearing loss, sneezing, congestion, runny nose or sore throat.  SKIN: No rash or itching.  CARDIOVASCULAR: per HPI RESPIRATORY: No shortness of breath, cough or sputum.  GASTROINTESTINAL: No anorexia, nausea, vomiting or diarrhea. No abdominal pain or blood.  GENITOURINARY: No burning on urination, no polyuria NEUROLOGICAL: No headache, dizziness, syncope, paralysis, ataxia, numbness or tingling in the extremities. No change in bowel or bladder control.  MUSCULOSKELETAL:  No muscle, back pain, joint pain or stiffness.  LYMPHATICS: No enlarged nodes. No history of splenectomy.  PSYCHIATRIC: No history of depression or anxiety.  ENDOCRINOLOGIC: No reports of sweating, cold or heat intolerance. No polyuria or polydipsia.  Marland Kitchen   Physical Examination p 76 bp 174/95 Wt 258 lbs BMI 34 Gen: resting comfortably, no acute distress HEENT: no scleral icterus, pupils equal round and reactive, no palptable cervical adenopathy,  CV: RRR, no m/r/g, no JVD, no carotid bruits Resp: Clear to auscultation bilaterally GI: abdomen is soft, non-tender, non-distended, normal bowel sounds, no hepatosplenomegaly MSK: extremities are warm, no edema.  Skin: warm, no rash Neuro:   no focal deficits Psych: appropriate affect   Diagnostic Studies 08/2012 Echo  LVEF 60%     Assessment and Plan  1. Afib  - no current symptoms  - he is very sensitive to multiple medications, pill in pocket flecanide is currently working well for him.  - he continues to refuse anticoagulation, continue to assess at follow ups. Continue ASA  2. HTN  - home bp's and clinic bp above goal  - he feels may be having some side effects on norvasc, will stop and increase enalapril to 10mg  bid.  - he is to keep bp log and drop off in 2 weeks  3. Generalized Fatigue  - improved but not resolved with improved thyroid supplementation - continues to not want sleep study, will readress at follow ups.    F/u 6 months    Arnoldo Lenis, M.D., F.A.C.C.

## 2013-10-29 NOTE — Patient Instructions (Signed)
   Stop Norvasc  Increase Enalapril to 10mg  twice a day  - printed script given today  Continue all other medications.   Your physician has requested that you regularly monitor and record your blood pressure readings at home. Please take readings approximately 2 hours after medication x 2 weeks and return to office for MD review.   Your physician wants you to follow up in: 6 months.  You will receive a reminder letter in the mail one-two months in advance.  If you don't receive a letter, please call our office to schedule the follow up appointment

## 2014-09-24 ENCOUNTER — Telehealth: Payer: Self-pay | Admitting: Cardiology

## 2014-09-24 NOTE — Telephone Encounter (Signed)
Patient decided to go with a Cardiologist that rounded at [facility]

## 2015-04-14 DIAGNOSIS — I10 Essential (primary) hypertension: Secondary | ICD-10-CM | POA: Diagnosis not present

## 2015-04-14 DIAGNOSIS — I4891 Unspecified atrial fibrillation: Secondary | ICD-10-CM | POA: Diagnosis not present

## 2015-05-28 DIAGNOSIS — I10 Essential (primary) hypertension: Secondary | ICD-10-CM | POA: Diagnosis not present

## 2015-05-28 DIAGNOSIS — M159 Polyosteoarthritis, unspecified: Secondary | ICD-10-CM | POA: Diagnosis not present

## 2015-05-29 DIAGNOSIS — E039 Hypothyroidism, unspecified: Secondary | ICD-10-CM | POA: Diagnosis not present

## 2015-08-17 DIAGNOSIS — I10 Essential (primary) hypertension: Secondary | ICD-10-CM | POA: Diagnosis not present

## 2015-08-17 DIAGNOSIS — I480 Paroxysmal atrial fibrillation: Secondary | ICD-10-CM | POA: Diagnosis not present

## 2015-09-16 DIAGNOSIS — Z125 Encounter for screening for malignant neoplasm of prostate: Secondary | ICD-10-CM | POA: Diagnosis not present

## 2015-09-16 DIAGNOSIS — Z299 Encounter for prophylactic measures, unspecified: Secondary | ICD-10-CM | POA: Diagnosis not present

## 2015-09-16 DIAGNOSIS — Z1211 Encounter for screening for malignant neoplasm of colon: Secondary | ICD-10-CM | POA: Diagnosis not present

## 2015-09-16 DIAGNOSIS — R5383 Other fatigue: Secondary | ICD-10-CM | POA: Diagnosis not present

## 2015-09-16 DIAGNOSIS — E7800 Pure hypercholesterolemia, unspecified: Secondary | ICD-10-CM | POA: Diagnosis not present

## 2015-09-16 DIAGNOSIS — Z79899 Other long term (current) drug therapy: Secondary | ICD-10-CM | POA: Diagnosis not present

## 2015-09-16 DIAGNOSIS — Z7189 Other specified counseling: Secondary | ICD-10-CM | POA: Diagnosis not present

## 2015-09-16 DIAGNOSIS — Z6834 Body mass index (BMI) 34.0-34.9, adult: Secondary | ICD-10-CM | POA: Diagnosis not present

## 2015-09-16 DIAGNOSIS — Z0000 Encounter for general adult medical examination without abnormal findings: Secondary | ICD-10-CM | POA: Diagnosis not present

## 2015-09-16 DIAGNOSIS — Z1389 Encounter for screening for other disorder: Secondary | ICD-10-CM | POA: Diagnosis not present

## 2015-10-29 DIAGNOSIS — H10502 Unspecified blepharoconjunctivitis, left eye: Secondary | ICD-10-CM | POA: Diagnosis not present

## 2015-10-29 DIAGNOSIS — H10501 Unspecified blepharoconjunctivitis, right eye: Secondary | ICD-10-CM | POA: Diagnosis not present

## 2015-12-03 DIAGNOSIS — H2511 Age-related nuclear cataract, right eye: Secondary | ICD-10-CM | POA: Diagnosis not present

## 2015-12-18 DIAGNOSIS — Z6835 Body mass index (BMI) 35.0-35.9, adult: Secondary | ICD-10-CM | POA: Diagnosis not present

## 2015-12-18 DIAGNOSIS — I10 Essential (primary) hypertension: Secondary | ICD-10-CM | POA: Diagnosis not present

## 2015-12-18 DIAGNOSIS — Z299 Encounter for prophylactic measures, unspecified: Secondary | ICD-10-CM | POA: Diagnosis not present

## 2015-12-18 DIAGNOSIS — I4891 Unspecified atrial fibrillation: Secondary | ICD-10-CM | POA: Diagnosis not present

## 2015-12-18 DIAGNOSIS — E039 Hypothyroidism, unspecified: Secondary | ICD-10-CM | POA: Diagnosis not present

## 2016-02-04 DIAGNOSIS — I4891 Unspecified atrial fibrillation: Secondary | ICD-10-CM | POA: Diagnosis not present

## 2016-02-04 DIAGNOSIS — I480 Paroxysmal atrial fibrillation: Secondary | ICD-10-CM | POA: Diagnosis present

## 2016-02-04 DIAGNOSIS — I10 Essential (primary) hypertension: Secondary | ICD-10-CM | POA: Diagnosis present

## 2016-02-04 DIAGNOSIS — Z79899 Other long term (current) drug therapy: Secondary | ICD-10-CM | POA: Diagnosis not present

## 2016-02-04 DIAGNOSIS — Z8546 Personal history of malignant neoplasm of prostate: Secondary | ICD-10-CM | POA: Diagnosis not present

## 2016-02-04 DIAGNOSIS — Z91011 Allergy to milk products: Secondary | ICD-10-CM | POA: Diagnosis not present

## 2016-02-04 DIAGNOSIS — Z7982 Long term (current) use of aspirin: Secondary | ICD-10-CM | POA: Diagnosis not present

## 2016-02-04 DIAGNOSIS — E039 Hypothyroidism, unspecified: Secondary | ICD-10-CM | POA: Diagnosis present

## 2016-02-04 DIAGNOSIS — Z96652 Presence of left artificial knee joint: Secondary | ICD-10-CM | POA: Diagnosis present

## 2016-02-04 DIAGNOSIS — R079 Chest pain, unspecified: Secondary | ICD-10-CM | POA: Diagnosis not present

## 2016-02-04 DIAGNOSIS — Z8249 Family history of ischemic heart disease and other diseases of the circulatory system: Secondary | ICD-10-CM | POA: Diagnosis not present

## 2016-02-04 DIAGNOSIS — Z888 Allergy status to other drugs, medicaments and biological substances status: Secondary | ICD-10-CM | POA: Diagnosis not present

## 2016-02-12 DIAGNOSIS — Z713 Dietary counseling and surveillance: Secondary | ICD-10-CM | POA: Diagnosis not present

## 2016-02-12 DIAGNOSIS — I4891 Unspecified atrial fibrillation: Secondary | ICD-10-CM | POA: Diagnosis not present

## 2016-02-12 DIAGNOSIS — Z6835 Body mass index (BMI) 35.0-35.9, adult: Secondary | ICD-10-CM | POA: Diagnosis not present

## 2016-02-12 DIAGNOSIS — E039 Hypothyroidism, unspecified: Secondary | ICD-10-CM | POA: Diagnosis not present

## 2016-02-12 DIAGNOSIS — Z299 Encounter for prophylactic measures, unspecified: Secondary | ICD-10-CM | POA: Diagnosis not present

## 2016-02-19 DIAGNOSIS — C61 Malignant neoplasm of prostate: Secondary | ICD-10-CM | POA: Diagnosis not present

## 2016-02-19 DIAGNOSIS — N5201 Erectile dysfunction due to arterial insufficiency: Secondary | ICD-10-CM | POA: Diagnosis not present

## 2016-04-21 DIAGNOSIS — L439 Lichen planus, unspecified: Secondary | ICD-10-CM | POA: Diagnosis not present

## 2016-04-21 DIAGNOSIS — Z6834 Body mass index (BMI) 34.0-34.9, adult: Secondary | ICD-10-CM | POA: Diagnosis not present

## 2016-04-21 DIAGNOSIS — E039 Hypothyroidism, unspecified: Secondary | ICD-10-CM | POA: Diagnosis not present

## 2016-04-21 DIAGNOSIS — H6123 Impacted cerumen, bilateral: Secondary | ICD-10-CM | POA: Diagnosis not present

## 2016-04-21 DIAGNOSIS — I10 Essential (primary) hypertension: Secondary | ICD-10-CM | POA: Diagnosis not present

## 2016-04-21 DIAGNOSIS — Z87891 Personal history of nicotine dependence: Secondary | ICD-10-CM | POA: Diagnosis not present

## 2016-04-21 DIAGNOSIS — I4891 Unspecified atrial fibrillation: Secondary | ICD-10-CM | POA: Diagnosis not present

## 2016-04-21 DIAGNOSIS — Z299 Encounter for prophylactic measures, unspecified: Secondary | ICD-10-CM | POA: Diagnosis not present

## 2016-04-21 DIAGNOSIS — R209 Unspecified disturbances of skin sensation: Secondary | ICD-10-CM | POA: Diagnosis not present

## 2016-07-28 DIAGNOSIS — Z6836 Body mass index (BMI) 36.0-36.9, adult: Secondary | ICD-10-CM | POA: Diagnosis not present

## 2016-07-28 DIAGNOSIS — Z713 Dietary counseling and surveillance: Secondary | ICD-10-CM | POA: Diagnosis not present

## 2016-07-28 DIAGNOSIS — G4733 Obstructive sleep apnea (adult) (pediatric): Secondary | ICD-10-CM | POA: Diagnosis not present

## 2016-07-28 DIAGNOSIS — I4891 Unspecified atrial fibrillation: Secondary | ICD-10-CM | POA: Diagnosis not present

## 2016-07-28 DIAGNOSIS — I10 Essential (primary) hypertension: Secondary | ICD-10-CM | POA: Diagnosis not present

## 2016-07-28 DIAGNOSIS — E039 Hypothyroidism, unspecified: Secondary | ICD-10-CM | POA: Diagnosis not present

## 2016-07-28 DIAGNOSIS — Z299 Encounter for prophylactic measures, unspecified: Secondary | ICD-10-CM | POA: Diagnosis not present

## 2016-07-28 DIAGNOSIS — E7800 Pure hypercholesterolemia, unspecified: Secondary | ICD-10-CM | POA: Diagnosis not present

## 2016-07-28 DIAGNOSIS — H6123 Impacted cerumen, bilateral: Secondary | ICD-10-CM | POA: Diagnosis not present

## 2016-08-16 DIAGNOSIS — I493 Ventricular premature depolarization: Secondary | ICD-10-CM | POA: Diagnosis not present

## 2016-08-16 DIAGNOSIS — I480 Paroxysmal atrial fibrillation: Secondary | ICD-10-CM | POA: Diagnosis not present

## 2016-08-16 DIAGNOSIS — I10 Essential (primary) hypertension: Secondary | ICD-10-CM | POA: Diagnosis not present

## 2016-09-16 DIAGNOSIS — R5383 Other fatigue: Secondary | ICD-10-CM | POA: Diagnosis not present

## 2016-09-16 DIAGNOSIS — Z79899 Other long term (current) drug therapy: Secondary | ICD-10-CM | POA: Diagnosis not present

## 2016-09-16 DIAGNOSIS — Z299 Encounter for prophylactic measures, unspecified: Secondary | ICD-10-CM | POA: Diagnosis not present

## 2016-09-16 DIAGNOSIS — Z125 Encounter for screening for malignant neoplasm of prostate: Secondary | ICD-10-CM | POA: Diagnosis not present

## 2016-09-16 DIAGNOSIS — Z1389 Encounter for screening for other disorder: Secondary | ICD-10-CM | POA: Diagnosis not present

## 2016-09-16 DIAGNOSIS — Z0000 Encounter for general adult medical examination without abnormal findings: Secondary | ICD-10-CM | POA: Diagnosis not present

## 2016-09-16 DIAGNOSIS — Z1211 Encounter for screening for malignant neoplasm of colon: Secondary | ICD-10-CM | POA: Diagnosis not present

## 2016-09-16 DIAGNOSIS — E7800 Pure hypercholesterolemia, unspecified: Secondary | ICD-10-CM | POA: Diagnosis not present

## 2016-09-16 DIAGNOSIS — G4733 Obstructive sleep apnea (adult) (pediatric): Secondary | ICD-10-CM | POA: Diagnosis not present

## 2016-09-16 DIAGNOSIS — E039 Hypothyroidism, unspecified: Secondary | ICD-10-CM | POA: Diagnosis not present

## 2016-09-16 DIAGNOSIS — Z6835 Body mass index (BMI) 35.0-35.9, adult: Secondary | ICD-10-CM | POA: Diagnosis not present

## 2016-09-16 DIAGNOSIS — I4891 Unspecified atrial fibrillation: Secondary | ICD-10-CM | POA: Diagnosis not present

## 2016-09-16 DIAGNOSIS — I10 Essential (primary) hypertension: Secondary | ICD-10-CM | POA: Diagnosis not present

## 2016-09-16 DIAGNOSIS — Z7189 Other specified counseling: Secondary | ICD-10-CM | POA: Diagnosis not present

## 2016-12-02 DIAGNOSIS — H2511 Age-related nuclear cataract, right eye: Secondary | ICD-10-CM | POA: Diagnosis not present

## 2016-12-20 DIAGNOSIS — G4733 Obstructive sleep apnea (adult) (pediatric): Secondary | ICD-10-CM | POA: Diagnosis not present

## 2016-12-20 DIAGNOSIS — I4891 Unspecified atrial fibrillation: Secondary | ICD-10-CM | POA: Diagnosis not present

## 2016-12-20 DIAGNOSIS — Z299 Encounter for prophylactic measures, unspecified: Secondary | ICD-10-CM | POA: Diagnosis not present

## 2016-12-20 DIAGNOSIS — I10 Essential (primary) hypertension: Secondary | ICD-10-CM | POA: Diagnosis not present

## 2016-12-20 DIAGNOSIS — E039 Hypothyroidism, unspecified: Secondary | ICD-10-CM | POA: Diagnosis not present

## 2016-12-20 DIAGNOSIS — Z6835 Body mass index (BMI) 35.0-35.9, adult: Secondary | ICD-10-CM | POA: Diagnosis not present

## 2017-02-15 DIAGNOSIS — C61 Malignant neoplasm of prostate: Secondary | ICD-10-CM | POA: Diagnosis not present

## 2017-02-22 DIAGNOSIS — C61 Malignant neoplasm of prostate: Secondary | ICD-10-CM | POA: Diagnosis not present

## 2017-03-22 DIAGNOSIS — Z299 Encounter for prophylactic measures, unspecified: Secondary | ICD-10-CM | POA: Diagnosis not present

## 2017-03-22 DIAGNOSIS — G4733 Obstructive sleep apnea (adult) (pediatric): Secondary | ICD-10-CM | POA: Diagnosis not present

## 2017-03-22 DIAGNOSIS — E039 Hypothyroidism, unspecified: Secondary | ICD-10-CM | POA: Diagnosis not present

## 2017-03-22 DIAGNOSIS — I10 Essential (primary) hypertension: Secondary | ICD-10-CM | POA: Diagnosis not present

## 2017-03-22 DIAGNOSIS — Z6835 Body mass index (BMI) 35.0-35.9, adult: Secondary | ICD-10-CM | POA: Diagnosis not present

## 2017-03-22 DIAGNOSIS — Z789 Other specified health status: Secondary | ICD-10-CM | POA: Diagnosis not present

## 2017-03-22 DIAGNOSIS — L439 Lichen planus, unspecified: Secondary | ICD-10-CM | POA: Diagnosis not present

## 2017-03-22 DIAGNOSIS — H6123 Impacted cerumen, bilateral: Secondary | ICD-10-CM | POA: Diagnosis not present

## 2017-03-22 DIAGNOSIS — L711 Rhinophyma: Secondary | ICD-10-CM | POA: Diagnosis not present

## 2017-09-20 DIAGNOSIS — Z7189 Other specified counseling: Secondary | ICD-10-CM | POA: Diagnosis not present

## 2017-09-20 DIAGNOSIS — Z79899 Other long term (current) drug therapy: Secondary | ICD-10-CM | POA: Diagnosis not present

## 2017-09-20 DIAGNOSIS — Z6835 Body mass index (BMI) 35.0-35.9, adult: Secondary | ICD-10-CM | POA: Diagnosis not present

## 2017-09-20 DIAGNOSIS — Z1331 Encounter for screening for depression: Secondary | ICD-10-CM | POA: Diagnosis not present

## 2017-09-20 DIAGNOSIS — E7800 Pure hypercholesterolemia, unspecified: Secondary | ICD-10-CM | POA: Diagnosis not present

## 2017-09-20 DIAGNOSIS — Z1339 Encounter for screening examination for other mental health and behavioral disorders: Secondary | ICD-10-CM | POA: Diagnosis not present

## 2017-09-20 DIAGNOSIS — I10 Essential (primary) hypertension: Secondary | ICD-10-CM | POA: Diagnosis not present

## 2017-09-20 DIAGNOSIS — Z125 Encounter for screening for malignant neoplasm of prostate: Secondary | ICD-10-CM | POA: Diagnosis not present

## 2017-09-20 DIAGNOSIS — R5383 Other fatigue: Secondary | ICD-10-CM | POA: Diagnosis not present

## 2017-09-20 DIAGNOSIS — E039 Hypothyroidism, unspecified: Secondary | ICD-10-CM | POA: Diagnosis not present

## 2017-09-20 DIAGNOSIS — Z0000 Encounter for general adult medical examination without abnormal findings: Secondary | ICD-10-CM | POA: Diagnosis not present

## 2017-09-20 DIAGNOSIS — Z1211 Encounter for screening for malignant neoplasm of colon: Secondary | ICD-10-CM | POA: Diagnosis not present

## 2017-09-20 DIAGNOSIS — Z299 Encounter for prophylactic measures, unspecified: Secondary | ICD-10-CM | POA: Diagnosis not present

## 2017-09-22 DIAGNOSIS — C61 Malignant neoplasm of prostate: Secondary | ICD-10-CM | POA: Diagnosis not present

## 2017-09-29 DIAGNOSIS — C61 Malignant neoplasm of prostate: Secondary | ICD-10-CM | POA: Diagnosis not present

## 2017-12-04 DIAGNOSIS — I10 Essential (primary) hypertension: Secondary | ICD-10-CM | POA: Diagnosis not present

## 2017-12-05 DIAGNOSIS — Z8249 Family history of ischemic heart disease and other diseases of the circulatory system: Secondary | ICD-10-CM | POA: Diagnosis not present

## 2017-12-05 DIAGNOSIS — L500 Allergic urticaria: Secondary | ICD-10-CM | POA: Diagnosis not present

## 2017-12-05 DIAGNOSIS — T7840XA Allergy, unspecified, initial encounter: Secondary | ICD-10-CM | POA: Diagnosis not present

## 2017-12-05 DIAGNOSIS — I4891 Unspecified atrial fibrillation: Secondary | ICD-10-CM | POA: Diagnosis not present

## 2017-12-05 DIAGNOSIS — Z7982 Long term (current) use of aspirin: Secondary | ICD-10-CM | POA: Diagnosis not present

## 2017-12-05 DIAGNOSIS — I10 Essential (primary) hypertension: Secondary | ICD-10-CM | POA: Diagnosis not present

## 2017-12-05 DIAGNOSIS — Z8546 Personal history of malignant neoplasm of prostate: Secondary | ICD-10-CM | POA: Diagnosis not present

## 2017-12-05 DIAGNOSIS — L509 Urticaria, unspecified: Secondary | ICD-10-CM | POA: Diagnosis not present

## 2017-12-05 DIAGNOSIS — Z79899 Other long term (current) drug therapy: Secondary | ICD-10-CM | POA: Diagnosis not present

## 2017-12-05 DIAGNOSIS — E039 Hypothyroidism, unspecified: Secondary | ICD-10-CM | POA: Diagnosis not present

## 2018-02-21 DIAGNOSIS — Z96652 Presence of left artificial knee joint: Secondary | ICD-10-CM | POA: Diagnosis not present

## 2018-02-21 DIAGNOSIS — M1711 Unilateral primary osteoarthritis, right knee: Secondary | ICD-10-CM | POA: Diagnosis not present

## 2018-04-24 DIAGNOSIS — I4891 Unspecified atrial fibrillation: Secondary | ICD-10-CM | POA: Diagnosis not present

## 2018-04-24 DIAGNOSIS — E039 Hypothyroidism, unspecified: Secondary | ICD-10-CM | POA: Diagnosis not present

## 2018-04-24 DIAGNOSIS — M1710 Unilateral primary osteoarthritis, unspecified knee: Secondary | ICD-10-CM | POA: Diagnosis not present

## 2018-04-24 DIAGNOSIS — Z6835 Body mass index (BMI) 35.0-35.9, adult: Secondary | ICD-10-CM | POA: Diagnosis not present

## 2018-04-24 DIAGNOSIS — Z299 Encounter for prophylactic measures, unspecified: Secondary | ICD-10-CM | POA: Diagnosis not present

## 2018-04-24 DIAGNOSIS — I10 Essential (primary) hypertension: Secondary | ICD-10-CM | POA: Diagnosis not present

## 2018-04-24 DIAGNOSIS — Z789 Other specified health status: Secondary | ICD-10-CM | POA: Diagnosis not present

## 2018-08-22 DIAGNOSIS — M1711 Unilateral primary osteoarthritis, right knee: Secondary | ICD-10-CM | POA: Diagnosis not present

## 2018-09-19 DIAGNOSIS — C61 Malignant neoplasm of prostate: Secondary | ICD-10-CM | POA: Diagnosis not present

## 2018-09-28 DIAGNOSIS — N5201 Erectile dysfunction due to arterial insufficiency: Secondary | ICD-10-CM | POA: Diagnosis not present

## 2018-09-28 DIAGNOSIS — C61 Malignant neoplasm of prostate: Secondary | ICD-10-CM | POA: Diagnosis not present

## 2018-11-13 DIAGNOSIS — Z1339 Encounter for screening examination for other mental health and behavioral disorders: Secondary | ICD-10-CM | POA: Diagnosis not present

## 2018-11-13 DIAGNOSIS — Z1211 Encounter for screening for malignant neoplasm of colon: Secondary | ICD-10-CM | POA: Diagnosis not present

## 2018-11-13 DIAGNOSIS — Z299 Encounter for prophylactic measures, unspecified: Secondary | ICD-10-CM | POA: Diagnosis not present

## 2018-11-13 DIAGNOSIS — Z1331 Encounter for screening for depression: Secondary | ICD-10-CM | POA: Diagnosis not present

## 2018-11-13 DIAGNOSIS — Z0000 Encounter for general adult medical examination without abnormal findings: Secondary | ICD-10-CM | POA: Diagnosis not present

## 2018-11-13 DIAGNOSIS — Z7189 Other specified counseling: Secondary | ICD-10-CM | POA: Diagnosis not present

## 2018-11-13 DIAGNOSIS — Z6836 Body mass index (BMI) 36.0-36.9, adult: Secondary | ICD-10-CM | POA: Diagnosis not present

## 2018-11-14 DIAGNOSIS — R5383 Other fatigue: Secondary | ICD-10-CM | POA: Diagnosis not present

## 2018-11-14 DIAGNOSIS — Z79899 Other long term (current) drug therapy: Secondary | ICD-10-CM | POA: Diagnosis not present

## 2018-11-14 DIAGNOSIS — Z125 Encounter for screening for malignant neoplasm of prostate: Secondary | ICD-10-CM | POA: Diagnosis not present

## 2018-11-14 DIAGNOSIS — E7800 Pure hypercholesterolemia, unspecified: Secondary | ICD-10-CM | POA: Diagnosis not present

## 2018-11-14 DIAGNOSIS — E039 Hypothyroidism, unspecified: Secondary | ICD-10-CM | POA: Diagnosis not present

## 2018-12-10 DIAGNOSIS — H2511 Age-related nuclear cataract, right eye: Secondary | ICD-10-CM | POA: Diagnosis not present

## 2018-12-10 DIAGNOSIS — I10 Essential (primary) hypertension: Secondary | ICD-10-CM | POA: Diagnosis not present

## 2019-05-02 DIAGNOSIS — Z23 Encounter for immunization: Secondary | ICD-10-CM | POA: Diagnosis not present

## 2019-05-15 DIAGNOSIS — Z6835 Body mass index (BMI) 35.0-35.9, adult: Secondary | ICD-10-CM | POA: Diagnosis not present

## 2019-05-15 DIAGNOSIS — I10 Essential (primary) hypertension: Secondary | ICD-10-CM | POA: Diagnosis not present

## 2019-05-15 DIAGNOSIS — I4891 Unspecified atrial fibrillation: Secondary | ICD-10-CM | POA: Diagnosis not present

## 2019-05-15 DIAGNOSIS — Z789 Other specified health status: Secondary | ICD-10-CM | POA: Diagnosis not present

## 2019-05-15 DIAGNOSIS — Z299 Encounter for prophylactic measures, unspecified: Secondary | ICD-10-CM | POA: Diagnosis not present

## 2019-05-15 DIAGNOSIS — E039 Hypothyroidism, unspecified: Secondary | ICD-10-CM | POA: Diagnosis not present

## 2019-05-15 DIAGNOSIS — Z2821 Immunization not carried out because of patient refusal: Secondary | ICD-10-CM | POA: Diagnosis not present

## 2019-05-27 DIAGNOSIS — H43393 Other vitreous opacities, bilateral: Secondary | ICD-10-CM | POA: Diagnosis not present

## 2019-05-27 DIAGNOSIS — H2511 Age-related nuclear cataract, right eye: Secondary | ICD-10-CM | POA: Diagnosis not present

## 2019-05-27 DIAGNOSIS — Z961 Presence of intraocular lens: Secondary | ICD-10-CM | POA: Diagnosis not present

## 2019-05-31 DIAGNOSIS — Z23 Encounter for immunization: Secondary | ICD-10-CM | POA: Diagnosis not present

## 2019-06-17 DIAGNOSIS — E039 Hypothyroidism, unspecified: Secondary | ICD-10-CM | POA: Diagnosis not present

## 2019-06-17 DIAGNOSIS — I4891 Unspecified atrial fibrillation: Secondary | ICD-10-CM | POA: Diagnosis not present

## 2019-06-17 DIAGNOSIS — Z299 Encounter for prophylactic measures, unspecified: Secondary | ICD-10-CM | POA: Diagnosis not present

## 2019-06-17 DIAGNOSIS — I10 Essential (primary) hypertension: Secondary | ICD-10-CM | POA: Diagnosis not present

## 2019-08-19 DIAGNOSIS — I10 Essential (primary) hypertension: Secondary | ICD-10-CM | POA: Diagnosis not present

## 2019-08-19 DIAGNOSIS — E039 Hypothyroidism, unspecified: Secondary | ICD-10-CM | POA: Diagnosis not present

## 2019-08-19 DIAGNOSIS — G4733 Obstructive sleep apnea (adult) (pediatric): Secondary | ICD-10-CM | POA: Diagnosis not present

## 2019-08-19 DIAGNOSIS — Z299 Encounter for prophylactic measures, unspecified: Secondary | ICD-10-CM | POA: Diagnosis not present

## 2019-08-19 DIAGNOSIS — I480 Paroxysmal atrial fibrillation: Secondary | ICD-10-CM | POA: Diagnosis not present

## 2019-09-23 DIAGNOSIS — I480 Paroxysmal atrial fibrillation: Secondary | ICD-10-CM | POA: Diagnosis not present

## 2019-09-27 DIAGNOSIS — C61 Malignant neoplasm of prostate: Secondary | ICD-10-CM | POA: Diagnosis not present

## 2019-10-11 DIAGNOSIS — C61 Malignant neoplasm of prostate: Secondary | ICD-10-CM | POA: Diagnosis not present

## 2019-10-11 DIAGNOSIS — N5201 Erectile dysfunction due to arterial insufficiency: Secondary | ICD-10-CM | POA: Diagnosis not present

## 2019-12-10 DIAGNOSIS — E7800 Pure hypercholesterolemia, unspecified: Secondary | ICD-10-CM | POA: Diagnosis not present

## 2019-12-10 DIAGNOSIS — Z7189 Other specified counseling: Secondary | ICD-10-CM | POA: Diagnosis not present

## 2019-12-10 DIAGNOSIS — Z6835 Body mass index (BMI) 35.0-35.9, adult: Secondary | ICD-10-CM | POA: Diagnosis not present

## 2019-12-10 DIAGNOSIS — Z1331 Encounter for screening for depression: Secondary | ICD-10-CM | POA: Diagnosis not present

## 2019-12-10 DIAGNOSIS — Z125 Encounter for screening for malignant neoplasm of prostate: Secondary | ICD-10-CM | POA: Diagnosis not present

## 2019-12-10 DIAGNOSIS — E039 Hypothyroidism, unspecified: Secondary | ICD-10-CM | POA: Diagnosis not present

## 2019-12-10 DIAGNOSIS — R5383 Other fatigue: Secondary | ICD-10-CM | POA: Diagnosis not present

## 2019-12-10 DIAGNOSIS — Z79899 Other long term (current) drug therapy: Secondary | ICD-10-CM | POA: Diagnosis not present

## 2019-12-10 DIAGNOSIS — Z1339 Encounter for screening examination for other mental health and behavioral disorders: Secondary | ICD-10-CM | POA: Diagnosis not present

## 2019-12-10 DIAGNOSIS — Z0000 Encounter for general adult medical examination without abnormal findings: Secondary | ICD-10-CM | POA: Diagnosis not present

## 2019-12-10 DIAGNOSIS — Z299 Encounter for prophylactic measures, unspecified: Secondary | ICD-10-CM | POA: Diagnosis not present

## 2019-12-17 DIAGNOSIS — I10 Essential (primary) hypertension: Secondary | ICD-10-CM | POA: Diagnosis not present

## 2019-12-17 DIAGNOSIS — M1710 Unilateral primary osteoarthritis, unspecified knee: Secondary | ICD-10-CM | POA: Diagnosis not present

## 2019-12-17 DIAGNOSIS — Z299 Encounter for prophylactic measures, unspecified: Secondary | ICD-10-CM | POA: Diagnosis not present

## 2019-12-17 DIAGNOSIS — I480 Paroxysmal atrial fibrillation: Secondary | ICD-10-CM | POA: Diagnosis not present

## 2019-12-17 DIAGNOSIS — M1711 Unilateral primary osteoarthritis, right knee: Secondary | ICD-10-CM | POA: Diagnosis not present

## 2019-12-17 DIAGNOSIS — E038 Other specified hypothyroidism: Secondary | ICD-10-CM | POA: Diagnosis not present

## 2019-12-17 DIAGNOSIS — Z6834 Body mass index (BMI) 34.0-34.9, adult: Secondary | ICD-10-CM | POA: Diagnosis not present

## 2020-01-04 DIAGNOSIS — I10 Essential (primary) hypertension: Secondary | ICD-10-CM | POA: Diagnosis not present

## 2020-01-25 DIAGNOSIS — Z23 Encounter for immunization: Secondary | ICD-10-CM | POA: Diagnosis not present

## 2020-02-04 DIAGNOSIS — I10 Essential (primary) hypertension: Secondary | ICD-10-CM | POA: Diagnosis not present

## 2020-03-05 DIAGNOSIS — I10 Essential (primary) hypertension: Secondary | ICD-10-CM | POA: Diagnosis not present

## 2020-04-06 DIAGNOSIS — I10 Essential (primary) hypertension: Secondary | ICD-10-CM | POA: Diagnosis not present

## 2020-05-04 DIAGNOSIS — I10 Essential (primary) hypertension: Secondary | ICD-10-CM | POA: Diagnosis not present

## 2020-05-05 DIAGNOSIS — Z789 Other specified health status: Secondary | ICD-10-CM | POA: Diagnosis not present

## 2020-05-05 DIAGNOSIS — Z299 Encounter for prophylactic measures, unspecified: Secondary | ICD-10-CM | POA: Diagnosis not present

## 2020-05-05 DIAGNOSIS — I480 Paroxysmal atrial fibrillation: Secondary | ICD-10-CM | POA: Diagnosis not present

## 2020-05-05 DIAGNOSIS — I10 Essential (primary) hypertension: Secondary | ICD-10-CM | POA: Diagnosis not present

## 2020-05-05 DIAGNOSIS — Z6835 Body mass index (BMI) 35.0-35.9, adult: Secondary | ICD-10-CM | POA: Diagnosis not present

## 2020-05-05 DIAGNOSIS — E039 Hypothyroidism, unspecified: Secondary | ICD-10-CM | POA: Diagnosis not present

## 2020-06-04 DIAGNOSIS — I10 Essential (primary) hypertension: Secondary | ICD-10-CM | POA: Diagnosis not present

## 2020-07-03 DIAGNOSIS — I10 Essential (primary) hypertension: Secondary | ICD-10-CM | POA: Diagnosis not present

## 2020-08-04 DIAGNOSIS — I10 Essential (primary) hypertension: Secondary | ICD-10-CM | POA: Diagnosis not present

## 2020-09-03 DIAGNOSIS — I10 Essential (primary) hypertension: Secondary | ICD-10-CM | POA: Diagnosis not present

## 2020-09-14 DIAGNOSIS — Z6835 Body mass index (BMI) 35.0-35.9, adult: Secondary | ICD-10-CM | POA: Diagnosis not present

## 2020-09-14 DIAGNOSIS — E038 Other specified hypothyroidism: Secondary | ICD-10-CM | POA: Diagnosis not present

## 2020-09-14 DIAGNOSIS — I480 Paroxysmal atrial fibrillation: Secondary | ICD-10-CM | POA: Diagnosis not present

## 2020-09-14 DIAGNOSIS — Z299 Encounter for prophylactic measures, unspecified: Secondary | ICD-10-CM | POA: Diagnosis not present

## 2020-09-14 DIAGNOSIS — I10 Essential (primary) hypertension: Secondary | ICD-10-CM | POA: Diagnosis not present

## 2020-09-14 DIAGNOSIS — G4733 Obstructive sleep apnea (adult) (pediatric): Secondary | ICD-10-CM | POA: Diagnosis not present

## 2020-10-02 DIAGNOSIS — I10 Essential (primary) hypertension: Secondary | ICD-10-CM | POA: Diagnosis not present

## 2020-11-04 DIAGNOSIS — I10 Essential (primary) hypertension: Secondary | ICD-10-CM | POA: Diagnosis not present

## 2020-12-04 DIAGNOSIS — I10 Essential (primary) hypertension: Secondary | ICD-10-CM | POA: Diagnosis not present

## 2020-12-11 DIAGNOSIS — Z79899 Other long term (current) drug therapy: Secondary | ICD-10-CM | POA: Diagnosis not present

## 2020-12-11 DIAGNOSIS — I10 Essential (primary) hypertension: Secondary | ICD-10-CM | POA: Diagnosis not present

## 2020-12-11 DIAGNOSIS — Z1331 Encounter for screening for depression: Secondary | ICD-10-CM | POA: Diagnosis not present

## 2020-12-11 DIAGNOSIS — E039 Hypothyroidism, unspecified: Secondary | ICD-10-CM | POA: Diagnosis not present

## 2020-12-11 DIAGNOSIS — Z0000 Encounter for general adult medical examination without abnormal findings: Secondary | ICD-10-CM | POA: Diagnosis not present

## 2020-12-11 DIAGNOSIS — Z2821 Immunization not carried out because of patient refusal: Secondary | ICD-10-CM | POA: Diagnosis not present

## 2020-12-11 DIAGNOSIS — Z125 Encounter for screening for malignant neoplasm of prostate: Secondary | ICD-10-CM | POA: Diagnosis not present

## 2020-12-11 DIAGNOSIS — Z1339 Encounter for screening examination for other mental health and behavioral disorders: Secondary | ICD-10-CM | POA: Diagnosis not present

## 2020-12-11 DIAGNOSIS — R5383 Other fatigue: Secondary | ICD-10-CM | POA: Diagnosis not present

## 2020-12-11 DIAGNOSIS — Z299 Encounter for prophylactic measures, unspecified: Secondary | ICD-10-CM | POA: Diagnosis not present

## 2020-12-11 DIAGNOSIS — Z7189 Other specified counseling: Secondary | ICD-10-CM | POA: Diagnosis not present

## 2020-12-11 DIAGNOSIS — E7800 Pure hypercholesterolemia, unspecified: Secondary | ICD-10-CM | POA: Diagnosis not present

## 2020-12-11 DIAGNOSIS — Z6835 Body mass index (BMI) 35.0-35.9, adult: Secondary | ICD-10-CM | POA: Diagnosis not present

## 2021-01-04 DIAGNOSIS — I10 Essential (primary) hypertension: Secondary | ICD-10-CM | POA: Diagnosis not present

## 2021-02-03 DIAGNOSIS — I10 Essential (primary) hypertension: Secondary | ICD-10-CM | POA: Diagnosis not present

## 2021-03-05 DIAGNOSIS — I10 Essential (primary) hypertension: Secondary | ICD-10-CM | POA: Diagnosis not present

## 2021-04-04 DIAGNOSIS — I10 Essential (primary) hypertension: Secondary | ICD-10-CM | POA: Diagnosis not present

## 2021-04-14 DIAGNOSIS — D485 Neoplasm of uncertain behavior of skin: Secondary | ICD-10-CM | POA: Diagnosis not present

## 2021-04-14 DIAGNOSIS — L738 Other specified follicular disorders: Secondary | ICD-10-CM | POA: Diagnosis not present

## 2021-05-04 DIAGNOSIS — I10 Essential (primary) hypertension: Secondary | ICD-10-CM | POA: Diagnosis not present

## 2021-06-03 DIAGNOSIS — I10 Essential (primary) hypertension: Secondary | ICD-10-CM | POA: Diagnosis not present

## 2021-06-14 DIAGNOSIS — Z789 Other specified health status: Secondary | ICD-10-CM | POA: Diagnosis not present

## 2021-06-14 DIAGNOSIS — I480 Paroxysmal atrial fibrillation: Secondary | ICD-10-CM | POA: Diagnosis not present

## 2021-06-14 DIAGNOSIS — Z6835 Body mass index (BMI) 35.0-35.9, adult: Secondary | ICD-10-CM | POA: Diagnosis not present

## 2021-06-14 DIAGNOSIS — I10 Essential (primary) hypertension: Secondary | ICD-10-CM | POA: Diagnosis not present

## 2021-06-14 DIAGNOSIS — Z299 Encounter for prophylactic measures, unspecified: Secondary | ICD-10-CM | POA: Diagnosis not present

## 2021-07-04 DIAGNOSIS — I10 Essential (primary) hypertension: Secondary | ICD-10-CM | POA: Diagnosis not present

## 2021-08-03 DIAGNOSIS — I10 Essential (primary) hypertension: Secondary | ICD-10-CM | POA: Diagnosis not present

## 2021-09-02 DIAGNOSIS — I10 Essential (primary) hypertension: Secondary | ICD-10-CM | POA: Diagnosis not present

## 2021-10-04 DIAGNOSIS — I10 Essential (primary) hypertension: Secondary | ICD-10-CM | POA: Diagnosis not present

## 2021-11-03 DIAGNOSIS — I10 Essential (primary) hypertension: Secondary | ICD-10-CM | POA: Diagnosis not present

## 2021-12-03 DIAGNOSIS — I10 Essential (primary) hypertension: Secondary | ICD-10-CM | POA: Diagnosis not present

## 2021-12-16 DIAGNOSIS — E038 Other specified hypothyroidism: Secondary | ICD-10-CM | POA: Diagnosis not present

## 2021-12-16 DIAGNOSIS — I10 Essential (primary) hypertension: Secondary | ICD-10-CM | POA: Diagnosis not present

## 2021-12-16 DIAGNOSIS — Z299 Encounter for prophylactic measures, unspecified: Secondary | ICD-10-CM | POA: Diagnosis not present

## 2021-12-16 DIAGNOSIS — R5383 Other fatigue: Secondary | ICD-10-CM | POA: Diagnosis not present

## 2021-12-16 DIAGNOSIS — Z79899 Other long term (current) drug therapy: Secondary | ICD-10-CM | POA: Diagnosis not present

## 2021-12-16 DIAGNOSIS — Z1331 Encounter for screening for depression: Secondary | ICD-10-CM | POA: Diagnosis not present

## 2021-12-16 DIAGNOSIS — I480 Paroxysmal atrial fibrillation: Secondary | ICD-10-CM | POA: Diagnosis not present

## 2021-12-16 DIAGNOSIS — Z6835 Body mass index (BMI) 35.0-35.9, adult: Secondary | ICD-10-CM | POA: Diagnosis not present

## 2021-12-16 DIAGNOSIS — Z7189 Other specified counseling: Secondary | ICD-10-CM | POA: Diagnosis not present

## 2021-12-16 DIAGNOSIS — Z125 Encounter for screening for malignant neoplasm of prostate: Secondary | ICD-10-CM | POA: Diagnosis not present

## 2021-12-16 DIAGNOSIS — E7800 Pure hypercholesterolemia, unspecified: Secondary | ICD-10-CM | POA: Diagnosis not present

## 2021-12-16 DIAGNOSIS — Z1339 Encounter for screening examination for other mental health and behavioral disorders: Secondary | ICD-10-CM | POA: Diagnosis not present

## 2021-12-16 DIAGNOSIS — Z0000 Encounter for general adult medical examination without abnormal findings: Secondary | ICD-10-CM | POA: Diagnosis not present

## 2021-12-20 DIAGNOSIS — I480 Paroxysmal atrial fibrillation: Secondary | ICD-10-CM | POA: Diagnosis not present

## 2022-01-03 DIAGNOSIS — I10 Essential (primary) hypertension: Secondary | ICD-10-CM | POA: Diagnosis not present

## 2022-02-02 DIAGNOSIS — I10 Essential (primary) hypertension: Secondary | ICD-10-CM | POA: Diagnosis not present

## 2022-03-04 DIAGNOSIS — I10 Essential (primary) hypertension: Secondary | ICD-10-CM | POA: Diagnosis not present

## 2022-03-14 DIAGNOSIS — I480 Paroxysmal atrial fibrillation: Secondary | ICD-10-CM | POA: Diagnosis not present

## 2022-03-14 DIAGNOSIS — I10 Essential (primary) hypertension: Secondary | ICD-10-CM | POA: Diagnosis not present

## 2022-03-28 DIAGNOSIS — I480 Paroxysmal atrial fibrillation: Secondary | ICD-10-CM | POA: Diagnosis not present

## 2022-03-29 DIAGNOSIS — R008 Other abnormalities of heart beat: Secondary | ICD-10-CM | POA: Diagnosis not present

## 2022-03-29 DIAGNOSIS — I4891 Unspecified atrial fibrillation: Secondary | ICD-10-CM | POA: Diagnosis not present

## 2022-03-29 DIAGNOSIS — I493 Ventricular premature depolarization: Secondary | ICD-10-CM | POA: Diagnosis not present

## 2022-03-29 DIAGNOSIS — I472 Ventricular tachycardia, unspecified: Secondary | ICD-10-CM | POA: Diagnosis not present

## 2022-05-04 DIAGNOSIS — I10 Essential (primary) hypertension: Secondary | ICD-10-CM | POA: Diagnosis not present

## 2022-05-17 DIAGNOSIS — I482 Chronic atrial fibrillation, unspecified: Secondary | ICD-10-CM | POA: Diagnosis not present

## 2022-05-17 DIAGNOSIS — I10 Essential (primary) hypertension: Secondary | ICD-10-CM | POA: Diagnosis not present

## 2022-05-17 DIAGNOSIS — I4729 Other ventricular tachycardia: Secondary | ICD-10-CM | POA: Diagnosis not present

## 2022-05-17 DIAGNOSIS — I2510 Atherosclerotic heart disease of native coronary artery without angina pectoris: Secondary | ICD-10-CM | POA: Diagnosis not present

## 2022-05-18 DIAGNOSIS — E039 Hypothyroidism, unspecified: Secondary | ICD-10-CM | POA: Diagnosis not present

## 2022-05-18 DIAGNOSIS — I480 Paroxysmal atrial fibrillation: Secondary | ICD-10-CM | POA: Diagnosis not present

## 2022-05-18 DIAGNOSIS — Z299 Encounter for prophylactic measures, unspecified: Secondary | ICD-10-CM | POA: Diagnosis not present

## 2022-05-18 DIAGNOSIS — I10 Essential (primary) hypertension: Secondary | ICD-10-CM | POA: Diagnosis not present

## 2022-05-18 DIAGNOSIS — Z6834 Body mass index (BMI) 34.0-34.9, adult: Secondary | ICD-10-CM | POA: Diagnosis not present

## 2022-05-25 DIAGNOSIS — I2510 Atherosclerotic heart disease of native coronary artery without angina pectoris: Secondary | ICD-10-CM | POA: Diagnosis not present

## 2022-05-25 DIAGNOSIS — I10 Essential (primary) hypertension: Secondary | ICD-10-CM | POA: Diagnosis not present

## 2022-05-25 DIAGNOSIS — I482 Chronic atrial fibrillation, unspecified: Secondary | ICD-10-CM | POA: Diagnosis not present

## 2022-05-25 DIAGNOSIS — I4729 Other ventricular tachycardia: Secondary | ICD-10-CM | POA: Diagnosis not present

## 2022-06-04 DIAGNOSIS — I10 Essential (primary) hypertension: Secondary | ICD-10-CM | POA: Diagnosis not present

## 2022-06-14 DIAGNOSIS — I4729 Other ventricular tachycardia: Secondary | ICD-10-CM | POA: Diagnosis not present

## 2022-06-14 DIAGNOSIS — I219 Acute myocardial infarction, unspecified: Secondary | ICD-10-CM | POA: Diagnosis not present

## 2022-06-14 DIAGNOSIS — I517 Cardiomegaly: Secondary | ICD-10-CM | POA: Diagnosis not present

## 2022-06-14 DIAGNOSIS — I2510 Atherosclerotic heart disease of native coronary artery without angina pectoris: Secondary | ICD-10-CM | POA: Diagnosis not present

## 2022-06-14 DIAGNOSIS — R079 Chest pain, unspecified: Secondary | ICD-10-CM | POA: Diagnosis not present

## 2022-07-05 DIAGNOSIS — I10 Essential (primary) hypertension: Secondary | ICD-10-CM | POA: Diagnosis not present

## 2022-08-05 DIAGNOSIS — I10 Essential (primary) hypertension: Secondary | ICD-10-CM | POA: Diagnosis not present

## 2022-08-17 DIAGNOSIS — Z299 Encounter for prophylactic measures, unspecified: Secondary | ICD-10-CM | POA: Diagnosis not present

## 2022-08-17 DIAGNOSIS — I480 Paroxysmal atrial fibrillation: Secondary | ICD-10-CM | POA: Diagnosis not present

## 2022-08-17 DIAGNOSIS — I10 Essential (primary) hypertension: Secondary | ICD-10-CM | POA: Diagnosis not present

## 2022-08-17 DIAGNOSIS — L711 Rhinophyma: Secondary | ICD-10-CM | POA: Diagnosis not present

## 2022-08-30 DIAGNOSIS — I482 Chronic atrial fibrillation, unspecified: Secondary | ICD-10-CM | POA: Diagnosis not present

## 2022-08-30 DIAGNOSIS — I10 Essential (primary) hypertension: Secondary | ICD-10-CM | POA: Diagnosis not present

## 2022-09-04 DIAGNOSIS — I10 Essential (primary) hypertension: Secondary | ICD-10-CM | POA: Diagnosis not present

## 2022-10-05 DIAGNOSIS — I10 Essential (primary) hypertension: Secondary | ICD-10-CM | POA: Diagnosis not present

## 2022-11-05 DIAGNOSIS — I10 Essential (primary) hypertension: Secondary | ICD-10-CM | POA: Diagnosis not present

## 2022-12-05 DIAGNOSIS — I10 Essential (primary) hypertension: Secondary | ICD-10-CM | POA: Diagnosis not present

## 2022-12-20 DIAGNOSIS — E039 Hypothyroidism, unspecified: Secondary | ICD-10-CM | POA: Diagnosis not present

## 2022-12-20 DIAGNOSIS — I10 Essential (primary) hypertension: Secondary | ICD-10-CM | POA: Diagnosis not present

## 2022-12-20 DIAGNOSIS — R52 Pain, unspecified: Secondary | ICD-10-CM | POA: Diagnosis not present

## 2022-12-20 DIAGNOSIS — I480 Paroxysmal atrial fibrillation: Secondary | ICD-10-CM | POA: Diagnosis not present

## 2022-12-20 DIAGNOSIS — E7800 Pure hypercholesterolemia, unspecified: Secondary | ICD-10-CM | POA: Diagnosis not present

## 2022-12-20 DIAGNOSIS — Z299 Encounter for prophylactic measures, unspecified: Secondary | ICD-10-CM | POA: Diagnosis not present

## 2022-12-20 DIAGNOSIS — Z1331 Encounter for screening for depression: Secondary | ICD-10-CM | POA: Diagnosis not present

## 2022-12-20 DIAGNOSIS — Z0000 Encounter for general adult medical examination without abnormal findings: Secondary | ICD-10-CM | POA: Diagnosis not present

## 2022-12-20 DIAGNOSIS — Z79899 Other long term (current) drug therapy: Secondary | ICD-10-CM | POA: Diagnosis not present

## 2022-12-20 DIAGNOSIS — R5383 Other fatigue: Secondary | ICD-10-CM | POA: Diagnosis not present

## 2022-12-20 DIAGNOSIS — M1710 Unilateral primary osteoarthritis, unspecified knee: Secondary | ICD-10-CM | POA: Diagnosis not present

## 2022-12-20 DIAGNOSIS — Z125 Encounter for screening for malignant neoplasm of prostate: Secondary | ICD-10-CM | POA: Diagnosis not present

## 2022-12-20 DIAGNOSIS — Z1339 Encounter for screening examination for other mental health and behavioral disorders: Secondary | ICD-10-CM | POA: Diagnosis not present

## 2022-12-20 DIAGNOSIS — Z7189 Other specified counseling: Secondary | ICD-10-CM | POA: Diagnosis not present

## 2023-01-04 DIAGNOSIS — I10 Essential (primary) hypertension: Secondary | ICD-10-CM | POA: Diagnosis not present

## 2023-02-03 DIAGNOSIS — I10 Essential (primary) hypertension: Secondary | ICD-10-CM | POA: Diagnosis not present

## 2023-03-06 DIAGNOSIS — I10 Essential (primary) hypertension: Secondary | ICD-10-CM | POA: Diagnosis not present

## 2023-03-22 DIAGNOSIS — Z299 Encounter for prophylactic measures, unspecified: Secondary | ICD-10-CM | POA: Diagnosis not present

## 2023-03-22 DIAGNOSIS — I480 Paroxysmal atrial fibrillation: Secondary | ICD-10-CM | POA: Diagnosis not present

## 2023-03-22 DIAGNOSIS — I10 Essential (primary) hypertension: Secondary | ICD-10-CM | POA: Diagnosis not present

## 2023-03-22 DIAGNOSIS — E039 Hypothyroidism, unspecified: Secondary | ICD-10-CM | POA: Diagnosis not present

## 2023-03-22 DIAGNOSIS — G4733 Obstructive sleep apnea (adult) (pediatric): Secondary | ICD-10-CM | POA: Diagnosis not present

## 2023-03-22 DIAGNOSIS — D751 Secondary polycythemia: Secondary | ICD-10-CM | POA: Diagnosis not present

## 2023-04-05 DIAGNOSIS — I10 Essential (primary) hypertension: Secondary | ICD-10-CM | POA: Diagnosis not present

## 2023-05-05 DIAGNOSIS — I10 Essential (primary) hypertension: Secondary | ICD-10-CM | POA: Diagnosis not present

## 2023-06-04 DIAGNOSIS — I10 Essential (primary) hypertension: Secondary | ICD-10-CM | POA: Diagnosis not present

## 2023-07-05 DIAGNOSIS — I10 Essential (primary) hypertension: Secondary | ICD-10-CM | POA: Diagnosis not present

## 2023-08-05 DIAGNOSIS — I10 Essential (primary) hypertension: Secondary | ICD-10-CM | POA: Diagnosis not present

## 2023-08-08 DIAGNOSIS — Z299 Encounter for prophylactic measures, unspecified: Secondary | ICD-10-CM | POA: Diagnosis not present

## 2023-08-08 DIAGNOSIS — I10 Essential (primary) hypertension: Secondary | ICD-10-CM | POA: Diagnosis not present

## 2023-08-08 DIAGNOSIS — G4733 Obstructive sleep apnea (adult) (pediatric): Secondary | ICD-10-CM | POA: Diagnosis not present

## 2023-08-08 DIAGNOSIS — D751 Secondary polycythemia: Secondary | ICD-10-CM | POA: Diagnosis not present

## 2023-08-08 DIAGNOSIS — E039 Hypothyroidism, unspecified: Secondary | ICD-10-CM | POA: Diagnosis not present

## 2023-08-08 DIAGNOSIS — I480 Paroxysmal atrial fibrillation: Secondary | ICD-10-CM | POA: Diagnosis not present

## 2023-08-08 DIAGNOSIS — M1710 Unilateral primary osteoarthritis, unspecified knee: Secondary | ICD-10-CM | POA: Diagnosis not present

## 2023-08-08 DIAGNOSIS — R52 Pain, unspecified: Secondary | ICD-10-CM | POA: Diagnosis not present

## 2023-09-04 DIAGNOSIS — I10 Essential (primary) hypertension: Secondary | ICD-10-CM | POA: Diagnosis not present

## 2023-09-07 DIAGNOSIS — Z0389 Encounter for observation for other suspected diseases and conditions ruled out: Secondary | ICD-10-CM | POA: Diagnosis not present

## 2023-09-07 DIAGNOSIS — I10 Essential (primary) hypertension: Secondary | ICD-10-CM | POA: Diagnosis not present

## 2023-09-07 DIAGNOSIS — I482 Chronic atrial fibrillation, unspecified: Secondary | ICD-10-CM | POA: Diagnosis not present

## 2023-09-07 DIAGNOSIS — G4733 Obstructive sleep apnea (adult) (pediatric): Secondary | ICD-10-CM | POA: Diagnosis not present

## 2023-09-07 DIAGNOSIS — Z6834 Body mass index (BMI) 34.0-34.9, adult: Secondary | ICD-10-CM | POA: Diagnosis not present

## 2023-10-05 DIAGNOSIS — I10 Essential (primary) hypertension: Secondary | ICD-10-CM | POA: Diagnosis not present

## 2023-11-04 DIAGNOSIS — I10 Essential (primary) hypertension: Secondary | ICD-10-CM | POA: Diagnosis not present

## 2023-12-05 DIAGNOSIS — I10 Essential (primary) hypertension: Secondary | ICD-10-CM | POA: Diagnosis not present

## 2023-12-22 DIAGNOSIS — E7800 Pure hypercholesterolemia, unspecified: Secondary | ICD-10-CM | POA: Diagnosis not present

## 2023-12-22 DIAGNOSIS — Z299 Encounter for prophylactic measures, unspecified: Secondary | ICD-10-CM | POA: Diagnosis not present

## 2023-12-22 DIAGNOSIS — G4733 Obstructive sleep apnea (adult) (pediatric): Secondary | ICD-10-CM | POA: Diagnosis not present

## 2023-12-22 DIAGNOSIS — E039 Hypothyroidism, unspecified: Secondary | ICD-10-CM | POA: Diagnosis not present

## 2023-12-22 DIAGNOSIS — Z1339 Encounter for screening examination for other mental health and behavioral disorders: Secondary | ICD-10-CM | POA: Diagnosis not present

## 2023-12-22 DIAGNOSIS — Z0000 Encounter for general adult medical examination without abnormal findings: Secondary | ICD-10-CM | POA: Diagnosis not present

## 2023-12-22 DIAGNOSIS — Z79899 Other long term (current) drug therapy: Secondary | ICD-10-CM | POA: Diagnosis not present

## 2023-12-22 DIAGNOSIS — Z1331 Encounter for screening for depression: Secondary | ICD-10-CM | POA: Diagnosis not present

## 2023-12-22 DIAGNOSIS — I10 Essential (primary) hypertension: Secondary | ICD-10-CM | POA: Diagnosis not present

## 2023-12-22 DIAGNOSIS — Z125 Encounter for screening for malignant neoplasm of prostate: Secondary | ICD-10-CM | POA: Diagnosis not present

## 2023-12-22 DIAGNOSIS — Z7189 Other specified counseling: Secondary | ICD-10-CM | POA: Diagnosis not present

## 2023-12-22 DIAGNOSIS — R5383 Other fatigue: Secondary | ICD-10-CM | POA: Diagnosis not present
